# Patient Record
Sex: Female | Born: 1955 | Race: White | Hispanic: No | Marital: Married | Smoking: Never smoker
Health system: Southern US, Community
[De-identification: ages and names within clinical notes are randomized; demographics above are authoritative.]

## PROBLEM LIST (undated history)

## (undated) DIAGNOSIS — C801 Malignant (primary) neoplasm, unspecified: Secondary | ICD-10-CM

## (undated) DIAGNOSIS — Z8042 Family history of malignant neoplasm of prostate: Secondary | ICD-10-CM

## (undated) DIAGNOSIS — T7840XA Allergy, unspecified, initial encounter: Secondary | ICD-10-CM

## (undated) DIAGNOSIS — Z808 Family history of malignant neoplasm of other organs or systems: Secondary | ICD-10-CM

## (undated) DIAGNOSIS — M1990 Unspecified osteoarthritis, unspecified site: Secondary | ICD-10-CM

## (undated) HISTORY — DX: Family history of malignant neoplasm of other organs or systems: Z80.8

## (undated) HISTORY — DX: Family history of malignant neoplasm of prostate: Z80.42

## (undated) HISTORY — DX: Allergy, unspecified, initial encounter: T78.40XA

## (undated) HISTORY — DX: Unspecified osteoarthritis, unspecified site: M19.90

## (undated) HISTORY — DX: Malignant (primary) neoplasm, unspecified: C80.1

## (undated) HISTORY — PX: COLONOSCOPY: SHX174

## (undated) HISTORY — PX: CHOLECYSTECTOMY: SHX55

## (undated) HISTORY — PX: OTHER SURGICAL HISTORY: SHX169

---

## 1999-02-04 ENCOUNTER — Other Ambulatory Visit: Admission: RE | Admit: 1999-02-04 | Discharge: 1999-02-04 | Payer: Self-pay | Admitting: Obstetrics & Gynecology

## 1999-05-20 ENCOUNTER — Encounter: Payer: Self-pay | Admitting: Internal Medicine

## 1999-05-20 ENCOUNTER — Ambulatory Visit (HOSPITAL_COMMUNITY): Admission: RE | Admit: 1999-05-20 | Discharge: 1999-05-20 | Payer: Self-pay | Admitting: Internal Medicine

## 1999-06-14 ENCOUNTER — Ambulatory Visit (HOSPITAL_COMMUNITY): Admission: RE | Admit: 1999-06-14 | Discharge: 1999-06-14 | Payer: Self-pay | Admitting: Internal Medicine

## 2000-03-15 ENCOUNTER — Other Ambulatory Visit: Admission: RE | Admit: 2000-03-15 | Discharge: 2000-03-15 | Payer: Self-pay | Admitting: Obstetrics & Gynecology

## 2001-03-16 ENCOUNTER — Other Ambulatory Visit: Admission: RE | Admit: 2001-03-16 | Discharge: 2001-03-16 | Payer: Self-pay | Admitting: Obstetrics & Gynecology

## 2002-03-19 ENCOUNTER — Other Ambulatory Visit: Admission: RE | Admit: 2002-03-19 | Discharge: 2002-03-19 | Payer: Self-pay | Admitting: Obstetrics & Gynecology

## 2003-03-31 ENCOUNTER — Other Ambulatory Visit: Admission: RE | Admit: 2003-03-31 | Discharge: 2003-03-31 | Payer: Self-pay | Admitting: Obstetrics & Gynecology

## 2004-04-14 ENCOUNTER — Other Ambulatory Visit: Admission: RE | Admit: 2004-04-14 | Discharge: 2004-04-14 | Payer: Self-pay | Admitting: Obstetrics & Gynecology

## 2005-04-22 ENCOUNTER — Other Ambulatory Visit: Admission: RE | Admit: 2005-04-22 | Discharge: 2005-04-22 | Payer: Self-pay | Admitting: Obstetrics & Gynecology

## 2005-05-30 ENCOUNTER — Ambulatory Visit: Payer: Self-pay | Admitting: Internal Medicine

## 2005-06-16 ENCOUNTER — Ambulatory Visit: Payer: Self-pay | Admitting: Internal Medicine

## 2007-05-08 ENCOUNTER — Encounter: Admission: RE | Admit: 2007-05-08 | Discharge: 2007-05-08 | Payer: Self-pay | Admitting: Obstetrics & Gynecology

## 2007-08-23 HISTORY — PX: MELANOMA EXCISION: SHX5266

## 2009-01-16 ENCOUNTER — Encounter (INDEPENDENT_AMBULATORY_CARE_PROVIDER_SITE_OTHER): Payer: Self-pay | Admitting: Surgery

## 2009-01-16 ENCOUNTER — Ambulatory Visit (HOSPITAL_BASED_OUTPATIENT_CLINIC_OR_DEPARTMENT_OTHER): Admission: RE | Admit: 2009-01-16 | Discharge: 2009-01-16 | Payer: Self-pay | Admitting: Surgery

## 2010-04-16 ENCOUNTER — Encounter: Payer: Self-pay | Admitting: Internal Medicine

## 2010-09-11 ENCOUNTER — Encounter: Payer: Self-pay | Admitting: *Deleted

## 2010-09-21 NOTE — Letter (Signed)
Summary: Colonoscopy Letter  [facility]  [address] [place], [place] [address]   Phone: [phone]  Fax: [phone]      April 16, 2010 MRN: [id number]   [facility] [address] [place], [place]  [address]   Dear Ms. MCCAIG,   According to your medical record, it is time for you to schedule a Colonoscopy. The American Cancer Society recommends this procedure as a method to detect early colon cancer. Patients with a family history of colon cancer, or a personal history of colon polyps or inflammatory bowel disease are at increased risk.  This letter has been generated based on the recommendations made at the time of your procedure. If you feel that in your particular situation this may no longer apply, please contact our office.  Please call our office at [phone] to schedule this appointment or to update your records at your earliest convenience.  Thank you for cooperating with [place] to provide you with the very best care possible.   Sincerely,  Wilhemina Bonito. Marina Goodell, M.D.  [facility] [phone]

## 2010-10-13 ENCOUNTER — Other Ambulatory Visit: Payer: Self-pay | Admitting: Dermatology

## 2010-11-30 LAB — BASIC METABOLIC PANEL
BUN: 12 mg/dL (ref 6–23)
CO2: 28 mEq/L (ref 19–32)
Calcium: 9.1 mg/dL (ref 8.4–10.5)
Chloride: 102 mEq/L (ref 96–112)
Creatinine, Ser: 0.77 mg/dL (ref 0.4–1.2)
GFR calc Af Amer: >60 mL/min (ref >60)
GFR calc non Af Amer: >60 mL/min (ref >60)
Glucose, Bld: 153 mg/dL — ABNORMAL HIGH (ref 70–99)
Potassium: 4.1 mEq/L (ref 3.5–5.1)
Sodium: 138 mEq/L (ref 135–145)

## 2010-11-30 LAB — CBC
HCT: 37.4 % (ref 36.0–46.0)
Hemoglobin: 12.9 g/dL (ref 12.0–15.0)
MCHC: 34.6 g/dL (ref 30.0–36.0)
MCV: 89.4 fL (ref 78.0–100.0)
Platelets: 240 10*3/uL (ref 150–400)
RBC: 4.18 MIL/uL (ref 3.87–5.11)
RDW: 13.5 % (ref 11.5–15.5)
WBC: 8.8 10*3/uL (ref 4.0–10.5)

## 2010-11-30 LAB — DIFFERENTIAL
Basophils Relative: 0 % (ref 0–1)
Monocytes Relative: 4 % (ref 3–12)
Neutro Abs: 5 10*3/uL (ref 1.7–7.7)
Neutrophils Relative %: 57 % (ref 43–77)

## 2011-01-04 NOTE — Op Note (Signed)
Jessica Macdonald, Jessica Macdonald               ACCOUNT NO.:  [id number]   MEDICAL RECORD NO.:  [id number]          PATIENT TYPE:  AMB   LOCATION:  DSC                          FACILITY:  [facility]   PHYSICIAN:  Wilmon Arms. Corliss Skains, M.D. DATE OF BIRTH:  11-Mar-1956   DATE OF PROCEDURE:  01/16/2009  DATE OF DISCHARGE:                               OPERATIVE REPORT   PREOPERATIVE DIAGNOSIS:  Melanoma in situ, right arm.   POSTOPERATIVE DIAGNOSIS:  Melanoma in situ, right arm.   PROCEDURE PERFORMED:  Wide excision of melanoma in situ of the right  arm.   SURGEON:  Wilmon Arms. Tsuei, MD   ANESTHESIA:  Local, MAC.   INDICATIONS:  This is a 55 year old female who presents after having a  shave biopsy of abnormal skin lesion on the right upper arm.  The biopsy  showed melanoma in situ with regression with 1 positive margin  laterally.  She presents now for complete wide excision.   DESCRIPTION OF PROCEDURE:  The patient was brought to the operating room  and was placed in the supine position on the operating table with the  right arm extended.  She was given intravenous sedation.  We prepped her  right arm with Betadine and draped in a sterile fashion.  A time-out was  taken to assure the proper patient and proper procedure.  I had outlined  an elliptical incision by taking 1 cm margins around the previous biopsy  site.  We anesthetized this entire area with 0.25% Marcaine with  epinephrine mixed one-to-one with 1% lidocaine.  We made incision with a  15 blade scalpel and took its full-thickness all the way down into the  subcutaneous fat.  Cautery was used to excise the entire specimen.  It  was oriented with a suture distally.  Cautery was then used for  hemostasis.  We closed the subcutaneous tissues with interrupted 3-0  Vicryl.  The 4-0 Monocryl was then used to close the skin in  subcuticular fashion.  Steri-Strips and clean dressings were applied.  The patient was then awakened and brought to the  recovery room in stable  condition.  All sponge, instrument, and needle counts were correct.      Wilmon Arms. Tsuei, M.D.  Electronically Signed     MKT/MEDQ  D:  01/16/2009  T:  01/17/2009  Job:  [id number]

## 2011-10-03 ENCOUNTER — Ambulatory Visit (AMBULATORY_SURGERY_CENTER): Payer: BC Managed Care – PPO | Admitting: *Deleted

## 2011-10-03 VITALS — Ht 63.0 in | Wt 180.0 lb

## 2011-10-03 DIAGNOSIS — Z1211 Encounter for screening for malignant neoplasm of colon: Secondary | ICD-10-CM

## 2011-10-03 MED ORDER — PEG-KCL-NACL-NASULF-NA ASC-C 100 G PO SOLR: ORAL | Status: DC

## 2011-10-04 ENCOUNTER — Encounter: Payer: Self-pay | Admitting: Internal Medicine

## 2011-10-17 ENCOUNTER — Encounter: Payer: Self-pay | Admitting: Internal Medicine

## 2011-10-17 ENCOUNTER — Ambulatory Visit (AMBULATORY_SURGERY_CENTER): Payer: BC Managed Care – PPO | Admitting: Internal Medicine

## 2011-10-17 VITALS — BP 118/76 | HR 76 | Temp 96.0°F | Resp 20 | Ht 63.0 in | Wt 180.0 lb

## 2011-10-17 DIAGNOSIS — Z800 Family history of malignant neoplasm of digestive organs: Secondary | ICD-10-CM

## 2011-10-17 DIAGNOSIS — Z1211 Encounter for screening for malignant neoplasm of colon: Secondary | ICD-10-CM

## 2011-10-17 MED ORDER — SODIUM CHLORIDE 0.9 % IV SOLN: 500.0000 mL | INTRAVENOUS | Status: DC

## 2011-10-17 NOTE — Op Note (Signed)
[facility] [address] [place], [place]  [address]  COLONOSCOPY PROCEDURE REPORT  PATIENT:  Jessica Macdonald, Jessica Macdonald  MR#:  [id number] BIRTHDATE:  [birth year], 55 yrs. old  GENDER:  female ENDOSCOPIST:  Wilhemina Bonito. Eda Keys, MD REF. BY:  Screening / Recall PROCEDURE DATE:  10/17/2011 PROCEDURE:  Higher-risk screening colonoscopy G0105  ASA CLASS:  Class II INDICATIONS:  colorectal cancer screening, high risk, family history of colon cancer ; mother and paternal GP ; last exam 05-2005 normal MEDICATIONS:   MAC sedation, administered by CRNA, propofol (Diprivan) 350 mg IV  DESCRIPTION OF PROCEDURE:   After the risks benefits and alternatives of the procedure were thoroughly explained, informed consent was obtained.  Digital rectal exam was performed and revealed no abnormalities.   The LB CF-H180AL [id number] endoscope was introduced through the anus and advanced to the cecum, which was identified by both the appendix and ileocecal valve, without limitations.  The quality of the prep was excellent, using MoviPrep.  The instrument was then slowly withdrawn as the colon was fully examined. <<PROCEDUREIMAGES>>  FINDINGS:  A normal appearing cecum, ileocecal valve, and appendiceal orifice were identified. The ascending, hepatic flexure, transverse, splenic flexure, descending, sigmoid colon, and rectum appeared unremarkable.  No polyps or cancers were seen. Retroflexed views in the rectum revealed no abnormalities.    The time to cecum =  2:19  minutes. The scope was then withdrawn in 9:49  minutes from the cecum and the procedure completed.  COMPLICATIONS:  None  ENDOSCOPIC IMPRESSION: 1) Normal colonoscopy 2) No polyps or cancers  RECOMMENDATIONS: 1) Follow up colonoscopy in 5 years (family hx)  ______________________________ Wilhemina Bonito. Eda Keys, MD  CC:  Bradd Canary, MD;  The Patient  n. eSIGNED:   Wilhemina Bonito. Eda Keys at 10/17/2011 02:09 PM  Leanor Kail, [phone]

## 2011-10-17 NOTE — Patient Instructions (Signed)

## 2011-10-17 NOTE — Progress Notes (Signed)
Patient did not experience any of the following events: a burn prior to discharge; a fall within the facility; wrong site/side/patient/procedure/implant event; or a hospital transfer or hospital admission upon discharge from the facility. (G8907) Patient did not have preoperative order for IV antibiotic SSI prophylaxis. (G8918)  

## 2011-10-18 ENCOUNTER — Telehealth: Payer: Self-pay | Admitting: *Deleted

## 2011-10-18 NOTE — Telephone Encounter (Signed)
  Follow up Call-  Call back number 10/17/2011  Post procedure Call Back phone  # [phone]  Permission to leave phone message Yes     Patient questions:  Do you have a fever, pain , or abdominal swelling? no   Have you tolerated food without any problems? yes  Have you been able to return to your normal activities? yes  Do you have any questions about your discharge instructions: Diet   no Medications  no Follow up visit  no  Do you have questions or concerns about your Care? no  Actions:

## 2012-09-05 ENCOUNTER — Other Ambulatory Visit: Payer: Self-pay | Admitting: Dermatology

## 2013-05-14 ENCOUNTER — Other Ambulatory Visit: Payer: Self-pay | Admitting: Dermatology

## 2013-10-30 ENCOUNTER — Other Ambulatory Visit: Payer: Self-pay | Admitting: Dermatology

## 2014-09-23 ENCOUNTER — Other Ambulatory Visit: Payer: Self-pay | Admitting: Obstetrics & Gynecology

## 2014-09-24 LAB — CYTOLOGY - PAP

## 2015-08-31 ENCOUNTER — Encounter (HOSPITAL_COMMUNITY): Payer: Self-pay

## 2015-08-31 ENCOUNTER — Emergency Department (HOSPITAL_COMMUNITY)
Admission: EM | Admit: 2015-08-31 | Discharge: 2015-09-01 | Disposition: A | Discharge status: Home / Self Care | Payer: BLUE CROSS/BLUE SHIELD | Attending: Emergency Medicine | Admitting: Emergency Medicine

## 2015-08-31 ENCOUNTER — Emergency Department (HOSPITAL_COMMUNITY): Payer: BLUE CROSS/BLUE SHIELD

## 2015-08-31 DIAGNOSIS — K8000 Calculus of gallbladder with acute cholecystitis without obstruction: Secondary | ICD-10-CM | POA: Diagnosis not present

## 2015-08-31 DIAGNOSIS — R1011 Right upper quadrant pain: Secondary | ICD-10-CM | POA: Diagnosis present

## 2015-08-31 DIAGNOSIS — Z79899 Other long term (current) drug therapy: Secondary | ICD-10-CM | POA: Diagnosis not present

## 2015-08-31 LAB — CBC
HEMATOCRIT: 40 % (ref 36.0–46.0)
HEMOGLOBIN: 12.7 g/dL (ref 12.0–15.0)
MCH: 29.5 pg (ref 26.0–34.0)
MCHC: 31.8 g/dL (ref 30.0–36.0)
MCV: 93 fL (ref 78.0–100.0)
Platelets: 296 10*3/uL (ref 150–400)
RBC: 4.3 MIL/uL (ref 3.87–5.11)
RDW: 13.6 % (ref 11.5–15.5)
WBC: 10.3 10*3/uL (ref 4.0–10.5)

## 2015-08-31 LAB — COMPREHENSIVE METABOLIC PANEL
ALBUMIN: 4.3 g/dL (ref 3.5–5.0)
ALT: 37 U/L (ref 14–54)
ANION GAP: 9 (ref 5–15)
AST: 50 U/L — AB (ref 15–41)
Alkaline Phosphatase: 131 U/L — ABNORMAL HIGH (ref 38–126)
BUN: 18 mg/dL (ref 6–20)
CO2: 28 mmol/L (ref 22–32)
Calcium: 9.6 mg/dL (ref 8.9–10.3)
Chloride: 102 mmol/L (ref 101–111)
Creatinine, Ser: 0.77 mg/dL (ref 0.44–1.00)
GFR calc Af Amer: >60 mL/min (ref >60)
GFR calc non Af Amer: >60 mL/min (ref >60)
GLUCOSE: 103 mg/dL — AB (ref 65–99)
POTASSIUM: 4.6 mmol/L (ref 3.5–5.1)
SODIUM: 139 mmol/L (ref 135–145)
Total Bilirubin: 0.6 mg/dL (ref 0.3–1.2)
Total Protein: 7.7 g/dL (ref 6.5–8.1)

## 2015-08-31 LAB — LIPASE, BLOOD: Lipase: 32 U/L (ref 11–51)

## 2015-08-31 NOTE — ED Notes (Signed)
Pt c/o intermittent R side, sharp, rib cage pain, abdominal pain/burning, and intermittent n/v/d x 3 days.  Pain score 5/10.  Pt reports taking Tums w/ some relief.  Pt reports "I've been battling this for a year."  Pt has not mentioned it to her PCP.

## 2015-08-31 NOTE — ED Notes (Signed)
Patient transported to Ultrasound 

## 2015-08-31 NOTE — ED Provider Notes (Signed)
[name]: [id number]     Arrival date & time 08/31/15  1814 History   First MD Initiated Contact with Patient 08/31/15 2206     Chief Complaint  Patient presents with  . Ribcage pain   . Abdominal Pain  . Emesis  . Diarrhea     (Consider location/radiation/quality/duration/timing/severity/associated sxs/prior Treatment) HPI Comments: Patient reports intermittent RUQ pain for several months, occasionally associated with nausea, vomiting, and diarrhea.  Current episode started on Friday, with pain more persistent than prior episodes.  Some relief of pain with antacids today. Pain is worse after eating.  Patient is a 60 y.o. female presenting with abdominal pain, vomiting, and diarrhea. The history is provided by the patient. No language interpreter was used.  Abdominal Pain Pain location:  RUQ Pain quality: burning   Pain radiates to:  R flank Pain severity:  Moderate Onset quality:  Gradual Duration:  3 days Timing:  Intermittent Progression:  Waxing and waning Chronicity:  Recurrent Associated symptoms: diarrhea, nausea and vomiting   Associated symptoms: no chills and no fever   Emesis Associated symptoms: abdominal pain and diarrhea   Associated symptoms: no chills   Diarrhea Associated symptoms: abdominal pain and vomiting   Associated symptoms: no chills and no fever     Past Medical History  Diagnosis Date  . Allergy     unknown   Past Surgical History  Procedure Laterality Date  . Melanoma excision  2009    right upper arm   Family History  Problem Relation Age of Onset  . Colon cancer Mother   . Colon cancer Paternal Grandmother    Social History  Substance Use Topics  . Smoking status: Never Smoker   . Smokeless tobacco: Never Used  . Alcohol Use: 0.6 oz/week    1 Glasses of wine per week   OB History    No data available     Review of Systems  Constitutional: Negative for fever and chills.  Gastrointestinal: Positive for nausea, vomiting, abdominal  pain and diarrhea.  Musculoskeletal: Positive for back pain.  All other systems reviewed and are negative.     Allergies  Review of patient's allergies indicates no known allergies.  Home Medications   Prior to Admission medications   Medication Sig Start Date End Date Taking? Authorizing Provider  b complex vitamins tablet Take 1 tablet by mouth daily.    Historical Provider, MD  Calcium Carbonate-Vitamin D (CALTRATE 600+D PO) Take 2 tablets by mouth 2 (two) times daily. Takes chewy ones    Historical Provider, MD  fish oil-omega-3 fatty acids 1000 MG capsule Take 1 g by mouth 2 (two) times a week.    Historical Provider, MD  Probiotic Product (PROBIOTIC & ACIDOPHILUS EX ST PO) Take 1 tablet by mouth daily.    Historical Provider, MD  Pseudoephedrine-Ibuprofen (IBUPROFEN AND PSE COLD & SINUS PO) Take 2 tablets by mouth as needed. allergies    Historical Provider, MD   BP 146/82 mmHg  Pulse 91  Temp(Src) 98.2 F (36.8 C) (Oral)  Resp 16  SpO2 100% Physical Exam  Constitutional: She is oriented to person, place, and time. She appears well-developed and well-nourished.  HENT:  Head: Normocephalic.  Eyes: Pupils are equal, round, and reactive to light.  Neck: Neck supple.  Cardiovascular: Normal rate and regular rhythm.   Pulmonary/Chest: Effort normal and breath sounds normal.  Abdominal: Soft. She exhibits no distension. There is tenderness. There is CVA tenderness.    Musculoskeletal: She exhibits no  edema or tenderness.  Neurological: She is alert and oriented to person, place, and time.  Skin: Skin is warm and dry.  Psychiatric: She has a normal mood and affect.  Nursing note and vitals reviewed.   ED Course  Procedures (including critical care time) Labs Review Labs Reviewed  COMPREHENSIVE METABOLIC PANEL - Abnormal; Notable for the following:    Glucose, Bld 103 (*)    AST 50 (*)    Alkaline Phosphatase 131 (*)    All other components within normal limits    LIPASE, BLOOD  CBC  URINALYSIS, ROUTINE W REFLEX MICROSCOPIC (NOT AT [facility])    Imaging Review US Abdomen Complete  08/31/2015  CLINICAL DATA:  Right upper quadrant and right flank pain. Symptoms for 3 days. EXAM: ABDOMEN ULTRASOUND COMPLETE COMPARISON:  None. FINDINGS: Gallbladder: Gallbladder filled with shadowing stones with a wall echo shadow complex. Gallbladder wall where visualized is normal. Sonographic Murphy sign is negative. Common bile duct: Diameter: 4.8 mm, normal. Liver: No focal lesion identified. Within normal limits in parenchymal echogenicity. Normal directional flow in the main portal vein. IVC: No abnormality visualized. Pancreas: Visualized portion unremarkable. Spleen: Size and appearance within normal limits. Right Kidney: Length: 10.7 cm. Echogenicity within normal limits. No mass or hydronephrosis visualized. Left Kidney: Length: 10.0 cm. Echogenicity within normal limits. No mass or hydronephrosis visualized. Abdominal aorta: No aneurysm visualized. Other findings: None.  No ascites. IMPRESSION: Cholelithiasis with gallbladder filled with stones creating a wall echo shadow complex. Gallbladder wall thickness normal were visualized. Negative sonographic Murphy sign. No biliary dilatation. Otherwise unremarkable abdominal ultrasound. Particularly, right kidney appears normal without hydronephrosis. Electronically Signed   By: Jeb Levering M.D.   On: 08/31/2015 23:30   I have personally reviewed and evaluated these images and lab results as part of my medical decision-making.   EKG Interpretation None     Lab and radiology results reviewed and shared with patient. Cholelithiasis. No obstruction or wall thickening. No indication of infection. Will discharge home with pain and nausea Rx. Follow-up with general surgery.  MDM   Final diagnoses:  None    Cholelithiasis. Care instructions provided. Return precautions discussed.    Etta Quill, NP 09/01/15  0023  Julianne Rice, MD 09/01/15 [phone]

## 2015-09-01 LAB — URINALYSIS, ROUTINE W REFLEX MICROSCOPIC
BILIRUBIN URINE: NEGATIVE
GLUCOSE, UA: NEGATIVE mg/dL
HGB URINE DIPSTICK: NEGATIVE
Ketones, ur: NEGATIVE mg/dL
Nitrite: NEGATIVE
PH: 6 (ref 5.0–8.0)
Protein, ur: NEGATIVE mg/dL
SPECIFIC GRAVITY, URINE: 1.015 (ref 1.005–1.030)

## 2015-09-01 LAB — URINE MICROSCOPIC-ADD ON
Bacteria, UA: NONE SEEN
RBC / HPF: NONE SEEN RBC/hpf (ref 0–5)

## 2015-09-01 MED ORDER — HYDROCODONE-ACETAMINOPHEN 5-325 MG PO TABS: 1.0000 | ORAL_TABLET | Freq: Four times a day (QID) | ORAL | Status: DC | PRN

## 2015-09-01 MED ORDER — ONDANSETRON 4 MG PO TBDP: ORAL_TABLET | ORAL | Status: DC

## 2015-09-01 NOTE — ED Notes (Signed)
Patient was alert, oriented and stable upon discharge. RN went over AVS and patient had no further questions.  

## 2015-09-01 NOTE — Discharge Instructions (Signed)

## 2015-10-13 ENCOUNTER — Other Ambulatory Visit: Payer: Self-pay | Admitting: Surgery

## 2016-08-24 IMAGING — US US ABDOMEN COMPLETE
1 series · 13 of 25 positions shown · non-contrast
Comparison: None.

CLINICAL DATA: Right upper quadrant and right flank pain. Symptoms
for 3 days.

EXAM:
ABDOMEN ULTRASOUND COMPLETE

[Series 1: us abdomen complete · 0.16mm/px · 13 of 66 slices shown]
[im 1/66]
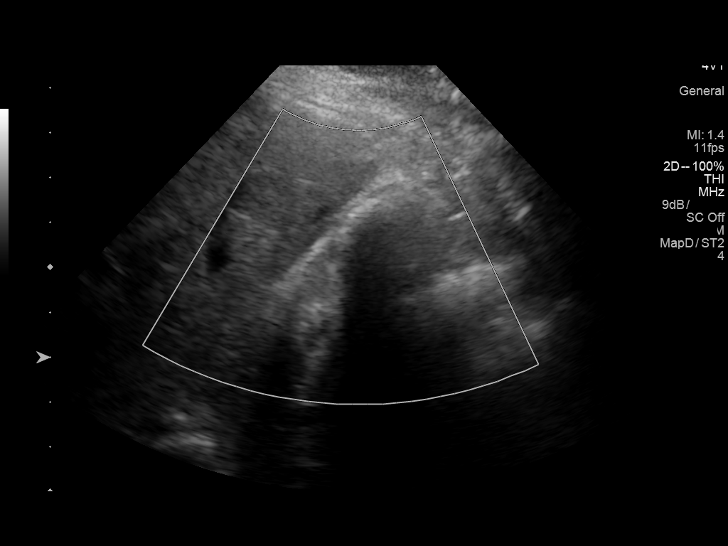
[im 6/66]
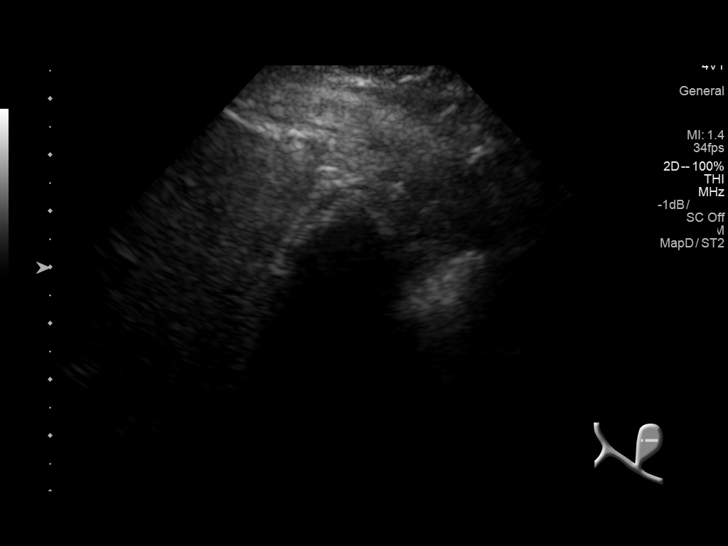
[im 11/66]
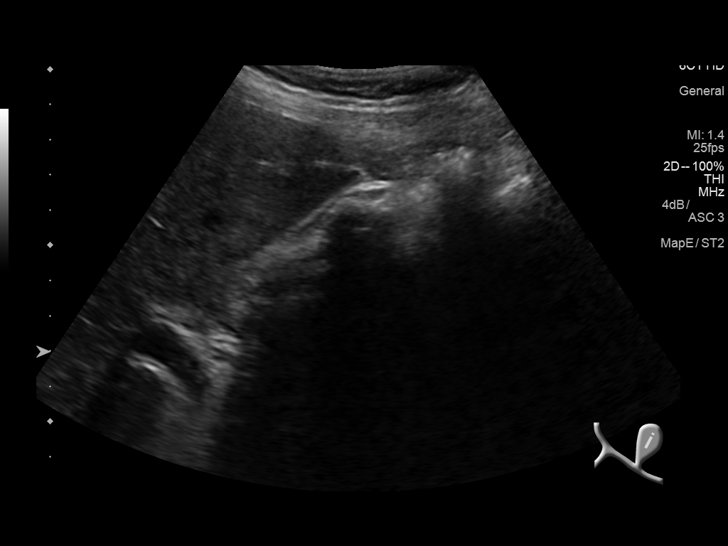
[im 17/66]
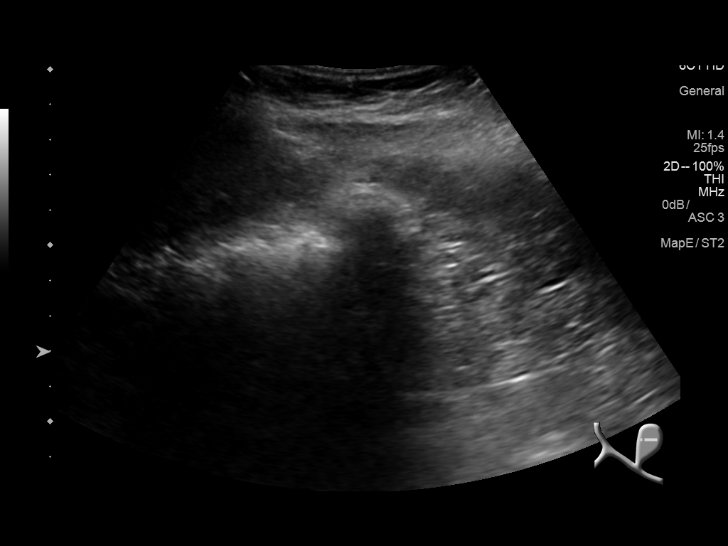
[im 22/66]
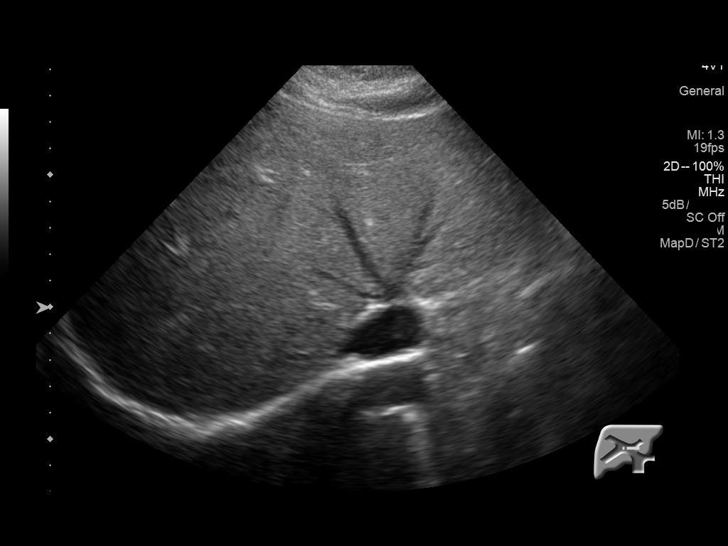
[im 28/66]
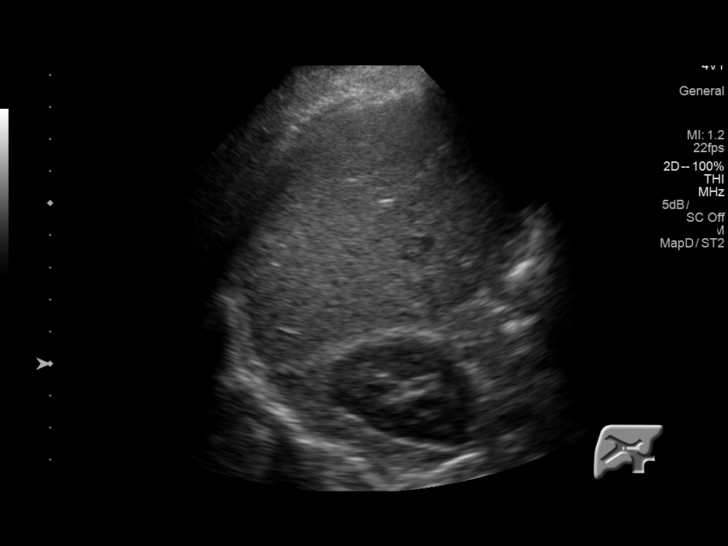
[im 33/66]
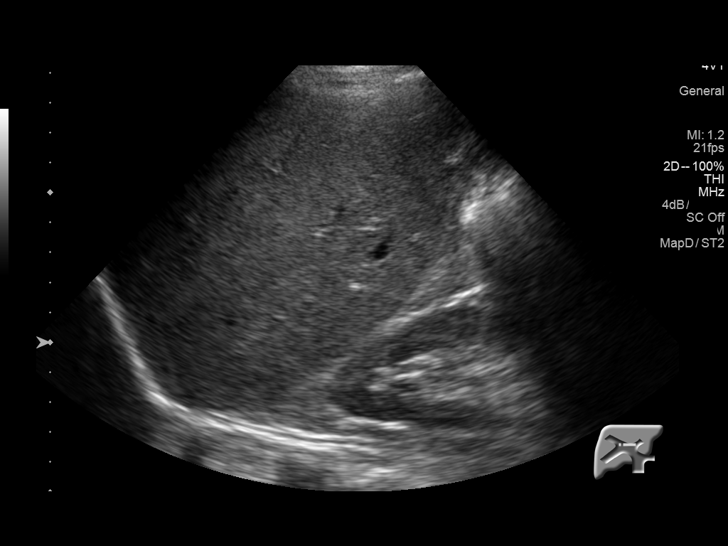
[im 38/66]
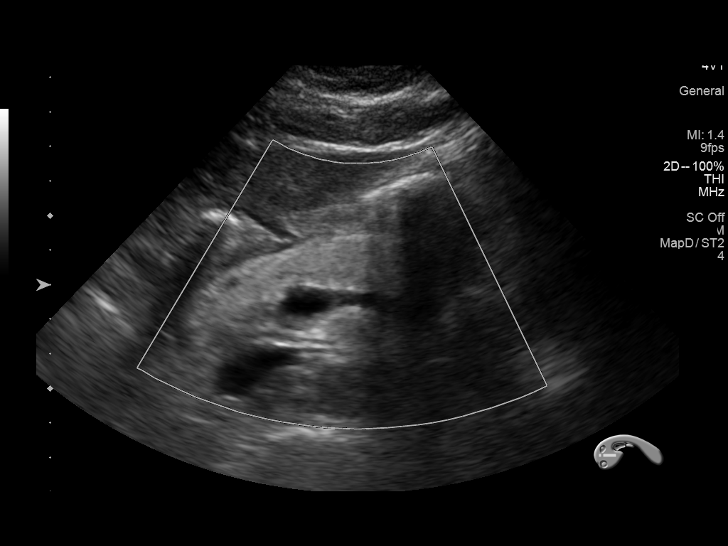
[im 44/66]
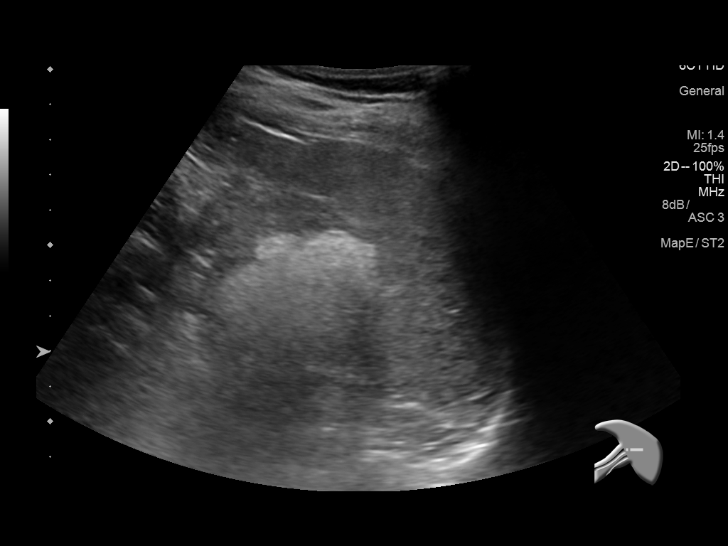
[im 49/66]
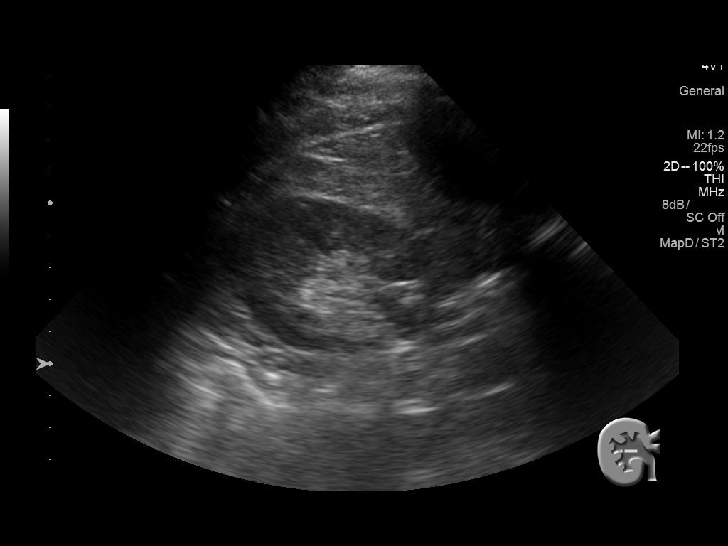
[im 55/66]
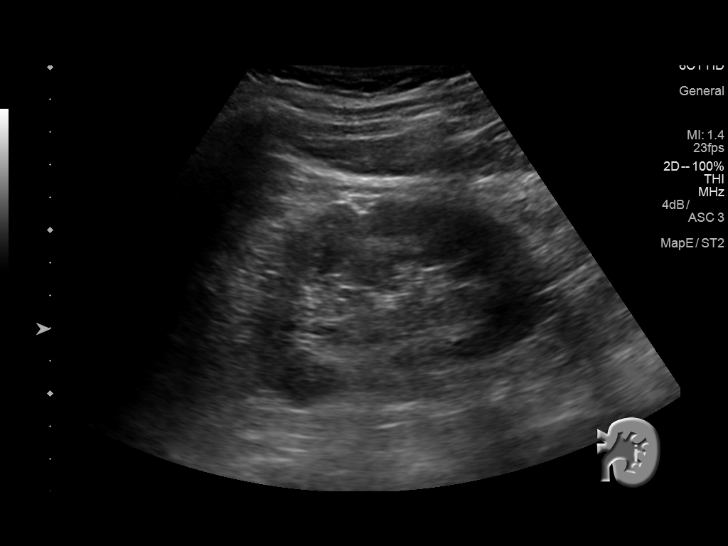
[im 60/66]
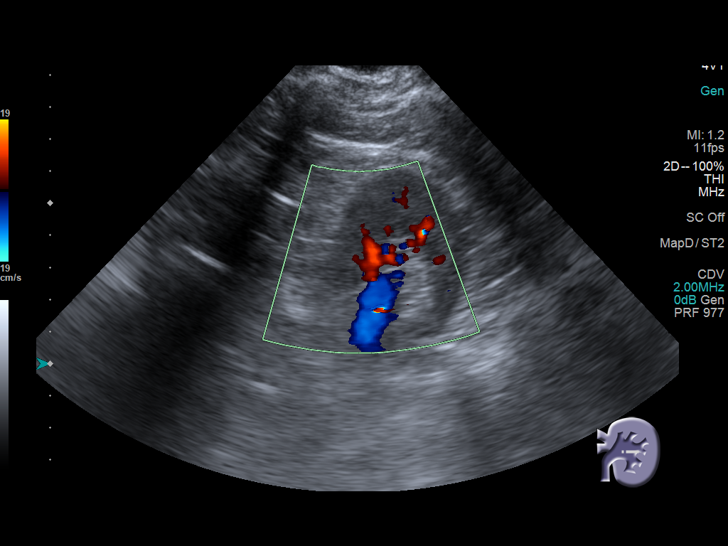
[im 66/66]
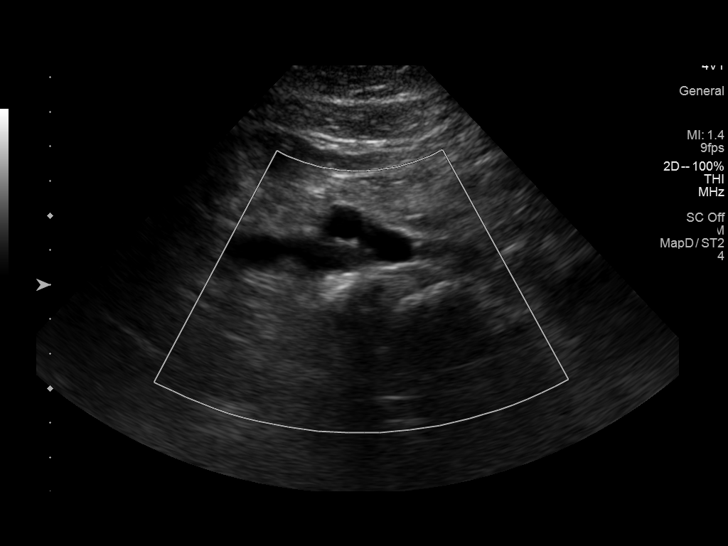

[13 of 25 positions shown; findings below may reference images not displayed]

FINDINGS: Gallbladder: Gallbladder filled with shadowing stones with a wall
echo shadow complex. Gallbladder wall where visualized is normal.
Sonographic Murphy sign is negative.

Common bile duct: Diameter: 4.8 mm, normal.

Liver: No focal lesion identified. Within normal limits in
parenchymal echogenicity. Normal directional flow in the main portal
vein.

IVC: No abnormality visualized.

Pancreas: Visualized portion unremarkable.

Spleen: Size and appearance within normal limits.

Right Kidney: Length: 10.7 cm. Echogenicity within normal limits. No
mass or hydronephrosis visualized.

Left Kidney: Length: 10.0 cm. Echogenicity within normal limits. No
mass or hydronephrosis visualized.

Abdominal aorta: No aneurysm visualized.

Other findings: None.  No ascites.
IMPRESSION: Cholelithiasis with gallbladder filled with stones creating a wall
echo shadow complex. Gallbladder wall thickness normal were
visualized. Negative sonographic Murphy sign. No biliary dilatation.

Otherwise unremarkable abdominal ultrasound. Particularly, right
kidney appears normal without hydronephrosis.

## 2016-08-31 ENCOUNTER — Encounter: Payer: Self-pay | Admitting: Internal Medicine

## 2016-09-27 ENCOUNTER — Encounter: Payer: Self-pay | Admitting: Internal Medicine

## 2016-11-22 ENCOUNTER — Ambulatory Visit (AMBULATORY_SURGERY_CENTER): Payer: Self-pay | Admitting: *Deleted

## 2016-11-22 VITALS — Ht 63.0 in | Wt 181.2 lb

## 2016-11-22 DIAGNOSIS — Z800 Family history of malignant neoplasm of digestive organs: Secondary | ICD-10-CM

## 2016-11-22 MED ORDER — NA SULFATE-K SULFATE-MG SULF 17.5-3.13-1.6 GM/177ML PO SOLN: ORAL | 0 refills | Status: DC

## 2016-11-22 NOTE — Progress Notes (Signed)
Hold phentermine for 10 days prior to procedure.  Patient aware.

## 2016-11-22 NOTE — Progress Notes (Signed)
Pt denies allergies to eggs or soy products. Denies difficulty with sedation or anesthesia. Denies any diet or weight loss medications. Denies use of supplemental oxygen.  Emmi instructions given for procedure.  

## 2016-11-23 ENCOUNTER — Encounter: Payer: Self-pay | Admitting: Internal Medicine

## 2016-12-06 ENCOUNTER — Ambulatory Visit (AMBULATORY_SURGERY_CENTER): Payer: BLUE CROSS/BLUE SHIELD | Admitting: Internal Medicine

## 2016-12-06 ENCOUNTER — Encounter: Payer: Self-pay | Admitting: Internal Medicine

## 2016-12-06 VITALS — BP 111/73 | HR 70 | Temp 97.3°F | Resp 13 | Ht 63.0 in | Wt 181.0 lb

## 2016-12-06 DIAGNOSIS — D123 Benign neoplasm of transverse colon: Secondary | ICD-10-CM

## 2016-12-06 DIAGNOSIS — D122 Benign neoplasm of ascending colon: Secondary | ICD-10-CM

## 2016-12-06 DIAGNOSIS — Z1212 Encounter for screening for malignant neoplasm of rectum: Secondary | ICD-10-CM

## 2016-12-06 DIAGNOSIS — D124 Benign neoplasm of descending colon: Secondary | ICD-10-CM | POA: Diagnosis not present

## 2016-12-06 DIAGNOSIS — Z1211 Encounter for screening for malignant neoplasm of colon: Secondary | ICD-10-CM

## 2016-12-06 DIAGNOSIS — Z800 Family history of malignant neoplasm of digestive organs: Secondary | ICD-10-CM | POA: Diagnosis not present

## 2016-12-06 MED ORDER — SODIUM CHLORIDE 0.9 % IV SOLN: 500.0000 mL | INTRAVENOUS | Status: DC

## 2016-12-06 NOTE — Progress Notes (Signed)
Spontaneous respirations throughout. VSS. Resting comfortably. To PACU on room air. Report to  Michelle RN. 

## 2016-12-06 NOTE — Progress Notes (Signed)
Called to room to assist during endoscopic procedure.  Patient ID and intended procedure confirmed with present staff. Received instructions for my participation in the procedure from the performing physician.  

## 2016-12-06 NOTE — Progress Notes (Signed)
Pt's states no medical or surgical changes since previsit or office visit. 

## 2016-12-06 NOTE — Patient Instructions (Signed)
YOU HAD AN ENDOSCOPIC PROCEDURE TODAY AT [facility]:   Refer to the procedure report that was given to you for any specific questions about what was found during the examination.  If the procedure report does not answer your questions, please call your gastroenterologist to clarify.  If you requested that your care partner not be given the details of your procedure findings, then the procedure report has been included in a sealed envelope for you to review at your convenience later.  YOU SHOULD EXPECT: Some feelings of bloating in the abdomen. Passage of more gas than usual.  Walking can help get rid of the air that was put into your GI tract during the procedure and reduce the bloating. If you had a lower endoscopy (such as a colonoscopy or flexible sigmoidoscopy) you may notice spotting of blood in your stool or on the toilet paper. If you underwent a bowel prep for your procedure, you may not have a normal bowel movement for a few days.  Please Note:  You might notice some irritation and congestion in your nose or some drainage.  This is from the oxygen used during your procedure.  There is no need for concern and it should clear up in a day or so.  SYMPTOMS TO REPORT IMMEDIATELY:   Following lower endoscopy (colonoscopy or flexible sigmoidoscopy):  Excessive amounts of blood in the stool  Significant tenderness or worsening of abdominal pains  Swelling of the abdomen that is new, acute  Fever of 100F or higher   For urgent or emergent issues, a gastroenterologist can be reached at any hour by calling [phone].   DIET:  We do recommend a small meal at first, but then you may proceed to your regular diet.  Drink plenty of fluids but you should avoid alcoholic beverages for 24 hours.  ACTIVITY:  You should plan to take it easy for the rest of today and you should NOT DRIVE or use heavy machinery until tomorrow (because of the sedation medicines used during the test).     FOLLOW UP: Our staff will call the number listed on your records the next business day following your procedure to check on you and address any questions or concerns that you may have regarding the information given to you following your procedure. If we do not reach you, we will leave a message.  However, if you are feeling well and you are not experiencing any problems, there is no need to return our call.  We will assume that you have returned to your regular daily activities without incident.  If any biopsies were taken you will be contacted by phone or by letter within the next 1-3 weeks.  Please call us at [phone] if you have not heard about the biopsies in 3 weeks.   Await biopsy results Repeat Colonoscopy screening in 3 years Polyps (handout given)   SIGNATURES/CONFIDENTIALITY: You and/or your care partner have signed paperwork which will be entered into your electronic medical record.  These signatures attest to the fact that that the information above on your After Visit Summary has been reviewed and is understood.  Full responsibility of the confidentiality of this discharge information lies with you and/or your care-partner.

## 2016-12-06 NOTE — Op Note (Signed)
[place] Patient Name: Jessica Macdonald Procedure Date: 12/06/2016 10:17 AM MRN: [id number] Endoscopist: Docia Chuck. Henrene Pastor , MD Age: 61 Referring MD:  Date of Birth: 11-12-1955 Gender: Female Account #: [id number] Procedure:                Colonoscopy, with cold snare polypectomy x 3 Indications:              Screening in patient at increased risk: Colorectal                            cancer in mother about age 46. Also paternal                            grandfather. Prior examinations 2001, 2006, and                            2013 negative for neoplasia Medicines:                Monitored Anesthesia Care Procedure:                Pre-Anesthesia Assessment:                           - Prior to the procedure, a History and Physical                            was performed, and patient medications and                            allergies were reviewed. The patient's tolerance of                            previous anesthesia was also reviewed. The risks                            and benefits of the procedure and the sedation                            options and risks were discussed with the patient.                            All questions were answered, and informed consent                            was obtained. Prior Anticoagulants: The patient has                            taken no previous anticoagulant or antiplatelet                            agents. ASA Grade Assessment: II - A patient with                            mild systemic disease. After reviewing the risks  and benefits, the patient was deemed in                            satisfactory condition to undergo the procedure.                           After obtaining informed consent, the colonoscope                            was passed under direct vision. Throughout the                            procedure, the patient's blood pressure, pulse, and                            oxygen  saturations were monitored continuously. The                            Model CF-[id number]) scope was introduced                            through the anus and advanced to the the cecum,                            identified by appendiceal orifice and ileocecal                            valve. The ileocecal valve, appendiceal orifice,                            and rectum were photographed. The quality of the                            bowel preparation was excellent. The colonoscopy                            was performed without difficulty. The patient                            tolerated the procedure well. The bowel preparation                            used was SUPREP. Scope In: 10:29:47 AM Scope Out: 10:42:34 AM Scope Withdrawal Time: 0 hours 10 minutes 49 seconds  Total Procedure Duration: 0 hours 12 minutes 47 seconds  Findings:                 Three polyps were found in the descending colon,                            transverse colon and ascending colon. The polyps                            were 3 to 6 mm in size. These polyps were removed  with a cold snare. Resection and retrieval were                            complete.                           The exam was otherwise without abnormality on                            direct and retroflexion views. Complications:            No immediate complications. Estimated blood loss:                            None. Estimated Blood Loss:     Estimated blood loss: none. Impression:               - Three 3 to 6 mm polyps in the descending colon,                            in the transverse colon and in the ascending colon,                            removed with a cold snare. Resected and retrieved.                           - The examination was otherwise normal on direct                            and retroflexion views. Recommendation:           - Repeat colonoscopy in 3 years if all polyps                             adenomatous, otherwise 5 years for surveillance.                           - Patient has a contact number available for                            emergencies. The signs and symptoms of potential                            delayed complications were discussed with the                            patient. Return to normal activities tomorrow.                            Written discharge instructions were provided to the                            patient.                           - Resume previous diet.                           -  Continue present medications.                           - Await pathology results. Docia Chuck. Henrene Pastor, MD 12/06/2016 10:48:21 AM This report has been signed electronically.

## 2016-12-07 ENCOUNTER — Telehealth: Payer: Self-pay

## 2016-12-07 NOTE — Telephone Encounter (Signed)
  Follow up Call-  Call back number 12/06/2016  Post procedure Call Back phone  # [phone]  Phone comments CALL THE [phone] HAD GIVEN DAUGHTER NUMBER .  Permission to leave phone message Yes  Some recent data might be hidden    Patient was called for follow up after her procedure on 12/06/16. No answer at the number given for follow up phone call. A message was left on voice mail.

## 2016-12-07 NOTE — Telephone Encounter (Signed)
  Follow up Call-  Call back number 12/06/2016  Post procedure Call Back phone  # [phone]  Phone comments CALL THE [phone] HAD GIVEN DAUGHTER NUMBER .  Permission to leave phone message Yes  Some recent data might be hidden     Patient questions:  Do you have a fever, pain , or abdominal swelling? No. Pain Score  0 *  Have you tolerated food without any problems? Yes.    Have you been able to return to your normal activities? Yes.    Do you have any questions about your discharge instructions: Diet   No. Medications  No. Follow up visit  Yes.    Do you have questions or concerns about your Care? No.  Actions: * If pain score is 4 or above: No action needed, pain <4.

## 2016-12-09 ENCOUNTER — Encounter: Payer: Self-pay | Admitting: Internal Medicine

## 2019-05-24 ENCOUNTER — Other Ambulatory Visit: Payer: Self-pay

## 2019-05-24 ENCOUNTER — Encounter: Payer: Self-pay | Admitting: Internal Medicine

## 2019-05-24 ENCOUNTER — Ambulatory Visit: Payer: BC Managed Care – PPO | Admitting: Internal Medicine

## 2019-05-24 VITALS — BP 129/77 | HR 74 | Temp 97.8°F | Ht 63.0 in | Wt 200.4 lb

## 2019-05-24 DIAGNOSIS — Z8371 Family history of colonic polyps: Secondary | ICD-10-CM

## 2019-05-24 DIAGNOSIS — K625 Hemorrhage of anus and rectum: Secondary | ICD-10-CM

## 2019-05-24 DIAGNOSIS — R194 Change in bowel habit: Secondary | ICD-10-CM | POA: Diagnosis not present

## 2019-05-24 MED ORDER — NA SULFATE-K SULFATE-MG SULF 17.5-3.13-1.6 GM/177ML PO SOLN: 1.0000 | Freq: Once | ORAL | 0 refills | Status: AC

## 2019-05-24 NOTE — Patient Instructions (Signed)
You have been scheduled for a colonoscopy. Please follow written instructions given to you at your visit today.  Please pick up your prep supplies at the pharmacy within the next 1-3 days. If you use inhalers (even only as needed), please bring them with you on the day of your procedure.   

## 2019-05-24 NOTE — Progress Notes (Signed)
HISTORY OF PRESENT ILLNESS:  Jessica Macdonald is a 63 y.o. female with a history of melanoma and prior cholecystectomy (2017) who presents today regarding change in bowel habits, rectal bleeding.  Patient reports a one-year history of intermittent rectal bleeding.  Mostly blood on the tissue.  Some associated rectal discomfort.  She wonders about hemorrhoids.  She is concerned about colon cancer.  She has had some weight gain.  Neck she reports change in bowel habits since her cholecystectomy.  In particular, her stools are softer or looser.  She has more postprandial urgency.  Patient does have a family history of colon cancer in her mother and grandfather.  Prior colonoscopic examinations 2001, 2006, 2013, and 2018.  Her most recent examination revealed multiple (3) adenomas.  Follow-up in 3 years recommended.  REVIEW OF SYSTEMS:  All non-GI ROS negative unless otherwise stated in the HPI except for itching  Past Medical History:  Diagnosis Date  . Allergy    seasonal  . Cancer ([place])    melanoma    Past Surgical History:  Procedure Laterality Date  . carpel tunnel    . CHOLECYSTECTOMY     2017 February  . MELANOMA EXCISION  2009   right upper arm    Social History Jessica Macdonald  reports that she has never smoked. She has never used smokeless tobacco. She reports current alcohol use of about 1.0 standard drinks of alcohol per week. She reports that she does not use drugs.  family history includes Colon cancer in her mother and paternal grandmother; Heart disease in her father.  No Known Allergies     PHYSICAL EXAMINATION: Vital signs: BP 129/77   Pulse 74   Temp 97.8 F (36.6 C)   Ht 5\' 3"  (1.6 m)   Wt 200 lb 6.4 oz (90.9 kg)   SpO2 98%   BMI 35.50 kg/m   Constitutional: generally well-appearing, no acute distress Psychiatric: alert and oriented x3, cooperative Eyes: extraocular movements intact, anicteric, conjunctiva pink Mouth: oral pharynx moist, no lesions Neck:  supple no lymphadenopathy Cardiovascular: heart regular rate and rhythm, no murmur Lungs: clear to auscultation bilaterally Abdomen: soft, nontender, nondistended, no obvious ascites, no peritoneal signs, normal bowel sounds, no organomegaly Rectal: Deferred until colonoscopy Extremities: no clubbing, cyanosis, or lower extremity edema bilaterally Skin: no lesions on visible extremities Neuro: No focal deficits.  Cranial nerves intact  ASSESSMENT:  1.  History of multiple adenomatous colon polyps 2.  Family history of colon cancer 3.  Minor rectal bleeding with rectal discomfort.  Rule out hemorrhoids.  Rule out fissure 4.  Change in bowel habits postcholecystectomy.  Suspect bile salt related loose stools   PLAN:  1.  Schedule colonoscopy to evaluate bleeding, change in bowel habits, and provide neoplasia surveillance.The nature of the procedure, as well as the risks, benefits, and alternatives were carefully and thoroughly reviewed with the patient. Ample time for discussion and questions allowed. The patient understood, was satisfied, and agreed to proceed. 2.  Would recommend fiber supplementation with Metamucil postcholecystectomy to improve bowel habit consistency 3.  Address etiology of rectal bleeding at the time of her colonoscopy

## 2019-05-27 ENCOUNTER — Encounter: Payer: Self-pay | Admitting: Internal Medicine

## 2019-05-27 ENCOUNTER — Other Ambulatory Visit: Payer: Self-pay

## 2019-05-27 ENCOUNTER — Ambulatory Visit (AMBULATORY_SURGERY_CENTER): Payer: BC Managed Care – PPO | Admitting: Internal Medicine

## 2019-05-27 VITALS — BP 112/75 | HR 75 | Temp 98.6°F | Resp 15 | Wt 200.0 lb

## 2019-05-27 DIAGNOSIS — K625 Hemorrhage of anus and rectum: Secondary | ICD-10-CM | POA: Diagnosis not present

## 2019-05-27 DIAGNOSIS — Z86010 Personal history of colonic polyps: Secondary | ICD-10-CM | POA: Diagnosis not present

## 2019-05-27 DIAGNOSIS — Z800 Family history of malignant neoplasm of digestive organs: Secondary | ICD-10-CM

## 2019-05-27 DIAGNOSIS — R194 Change in bowel habit: Secondary | ICD-10-CM

## 2019-05-27 DIAGNOSIS — Z8371 Family history of colonic polyps: Secondary | ICD-10-CM

## 2019-05-27 MED ORDER — SODIUM CHLORIDE 0.9 % IV SOLN: 500.0000 mL | Freq: Once | INTRAVENOUS | Status: DC

## 2019-05-27 NOTE — Op Note (Signed)
[facility] Patient Name: Jessica Macdonald Procedure Date: 05/27/2019 8:34 AM MRN: [id number] Endoscopist: Docia Chuck. Henrene Pastor , MD Age: 63 Referring MD:  Date of Birth: [birth year] Gender: Female Account #: [id number] Procedure:                Colonoscopy Indications:              High risk colon cancer surveillance: Personal                            history of multiple (3 or more) adenomas. Previous                            examinations 2001, 2006, 2013, 2018. Parent and                            grandparent with a history of colon cancer. Patient                            also complained of intermittent minor rectal                            bleeding and change in bowel habits Medicines:                Monitored Anesthesia Care Procedure:                Pre-Anesthesia Assessment:                           - Prior to the procedure, a History and Physical                            was performed, and patient medications and                            allergies were reviewed. The patient's tolerance of                            previous anesthesia was also reviewed. The risks                            and benefits of the procedure and the sedation                            options and risks were discussed with the patient.                            All questions were answered, and informed consent                            was obtained. Prior Anticoagulants: The patient has                            taken no previous anticoagulant or antiplatelet  agents. ASA Grade Assessment: II - A patient with                            mild systemic disease. After reviewing the risks                            and benefits, the patient was deemed in                            satisfactory condition to undergo the procedure.                           After obtaining informed consent, the colonoscope                            was passed under direct vision.  Throughout the                            procedure, the patient's blood pressure, pulse, and                            oxygen saturations were monitored continuously. The                            Colonoscope was introduced through the anus and                            advanced to the the cecum, identified by                            appendiceal orifice and ileocecal valve. The                            ileocecal valve, appendiceal orifice, and rectum                            were photographed. The quality of the bowel                            preparation was excellent. The colonoscopy was                            performed without difficulty. The patient tolerated                            the procedure well. The bowel preparation used was                            SUPREP via split dose instruction. Scope In: 8:49:36 AM Scope Out: 8:59:27 AM Scope Withdrawal Time: 0 hours 5 minutes 48 seconds  Total Procedure Duration: 0 hours 9 minutes 51 seconds  Findings:                 The entire examined colon appeared normal on direct  and retroflexion views. Complications:            No immediate complications. Estimated blood loss:                            None. Estimated Blood Loss:     Estimated blood loss: none. Impression:               - The entire examined colon is normal on direct and                            retroflexion views.                           - No significant hemorrhoids                           - Rash in gluteal fold consistent with fungal                            origin. Recommendation:           - Repeat colonoscopy in 5 years for surveillance.                           - Patient has a contact number available for                            emergencies. The signs and symptoms of potential                            delayed complications were discussed with the                            patient. Return to normal activities  tomorrow.                            Written discharge instructions were provided to the                            patient.                           - Resume previous diet.                           - Continue present medications.                           - Pick up over-the-counter antifungal cream and                            apply to affected area twice daily for 2 weeks. If                            the problem persist, see your PCP or dermatologist Docia Chuck. Henrene Pastor, MD 05/27/2019 9:06:25 AM This report has been signed electronically.

## 2019-05-27 NOTE — Progress Notes (Signed)
PT taken to PACU. Monitors in place. VSS. Report given to RN. 

## 2019-05-27 NOTE — Patient Instructions (Signed)
Thank you for allowing [place] to care for you today!  Resume previous diet and medications today.  Pick up over-the-counter anti-fungal cream and apply twice daily to affected area.  If problems persists, see your PCP or dermatologist.  Return to your normal activities tomorrow.  Recommend next screening colonoscopy in 5 years for surveillance.      YOU HAD AN ENDOSCOPIC PROCEDURE TODAY AT [facility]:   Refer to the procedure report that was given to you for any specific questions about what was found during the examination.  If the procedure report does not answer your questions, please call your gastroenterologist to clarify.  If you requested that your care partner not be given the details of your procedure findings, then the procedure report has been included in a sealed envelope for you to review at your convenience later.  YOU SHOULD EXPECT: Some feelings of bloating in the abdomen. Passage of more gas than usual.  Walking can help get rid of the air that was put into your GI tract during the procedure and reduce the bloating. If you had a lower endoscopy (such as a colonoscopy or flexible sigmoidoscopy) you may notice spotting of blood in your stool or on the toilet paper. If you underwent a bowel prep for your procedure, you may not have a normal bowel movement for a few days.  Please Note:  You might notice some irritation and congestion in your nose or some drainage.  This is from the oxygen used during your procedure.  There is no need for concern and it should clear up in a day or so.  SYMPTOMS TO REPORT IMMEDIATELY:   Following lower endoscopy (colonoscopy or flexible sigmoidoscopy):  Excessive amounts of blood in the stool  Significant tenderness or worsening of abdominal pains  Swelling of the abdomen that is new, acute  Fever of 100F or higher   For urgent or emergent issues, a gastroenterologist can be reached at any hour by calling [phone]  [phone].   DIET:  We do recommend a small meal at first, but then you may proceed to your regular diet.  Drink plenty of fluids but you should avoid alcoholic beverages for 24 hours.  ACTIVITY:  You should plan to take it easy for the rest of today and you should NOT DRIVE or use heavy machinery until tomorrow (because of the sedation medicines used during the test).    FOLLOW UP: Our staff will call the number listed on your records 48-72 hours following your procedure to check on you and address any questions or concerns that you may have regarding the information given to you following your procedure. If we do not reach you, we will leave a message.  We will attempt to reach you two times.  During this call, we will ask if you have developed any symptoms of COVID 19. If you develop any symptoms (ie: fever, flu-like symptoms, shortness of breath, cough etc.) before then, please call [phone].  If you test positive for Covid 19 in the 2 weeks post procedure, please call and report this information to [place].    If any biopsies were taken you will be contacted by phone or by letter within the next 1-3 weeks.  Please call us at [phone] if you have not heard about the biopsies in 3 weeks.    SIGNATURES/CONFIDENTIALITY: You and/or your care partner have signed paperwork which will be entered into your electronic medical record.  These signatures attest to  the fact that that the information above on your After Visit Summary has been reviewed and is understood.  Full responsibility of the confidentiality of this discharge information lies with you and/or your care-partner.

## 2019-05-27 NOTE — Progress Notes (Signed)
Pt's states no medical or surgical changes since previsit or office visit.  TEMP KA  VITALS  CWPt's states no medical or surgical changes since previsit or office visit.

## 2019-05-29 ENCOUNTER — Telehealth: Payer: Self-pay

## 2019-05-29 NOTE — Telephone Encounter (Signed)
Second post procedure follow up call, no answer 

## 2019-05-29 NOTE — Telephone Encounter (Signed)
LVM

## 2020-04-07 ENCOUNTER — Other Ambulatory Visit: Payer: Self-pay | Admitting: Surgery

## 2020-05-07 ENCOUNTER — Other Ambulatory Visit: Payer: Self-pay | Admitting: Surgery

## 2020-07-23 ENCOUNTER — Other Ambulatory Visit: Payer: Self-pay

## 2020-07-23 ENCOUNTER — Ambulatory Visit
Admission: EM | Admit: 2020-07-23 | Discharge: 2020-07-23 | Disposition: A | Discharge status: Home / Self Care | Payer: BC Managed Care – PPO | Attending: Emergency Medicine | Admitting: Emergency Medicine

## 2020-07-23 DIAGNOSIS — Z20822 Contact with and (suspected) exposure to covid-19: Secondary | ICD-10-CM | POA: Diagnosis not present

## 2020-07-23 DIAGNOSIS — J22 Unspecified acute lower respiratory infection: Secondary | ICD-10-CM | POA: Diagnosis not present

## 2020-07-23 MED ORDER — BENZONATATE 200 MG PO CAPS: 200.0000 mg | ORAL_CAPSULE | Freq: Three times a day (TID) | ORAL | 0 refills | Status: AC | PRN

## 2020-07-23 MED ORDER — HYDROCODONE-HOMATROPINE 5-1.5 MG/5ML PO SYRP: 5.0000 mL | ORAL_SOLUTION | Freq: Every evening | ORAL | 0 refills | Status: DC | PRN

## 2020-07-23 MED ORDER — AMOXICILLIN-POT CLAVULANATE 875-125 MG PO TABS: 1.0000 | ORAL_TABLET | Freq: Two times a day (BID) | ORAL | 0 refills | Status: DC

## 2020-07-23 MED ORDER — PREDNISONE 20 MG PO TABS: 40.0000 mg | ORAL_TABLET | Freq: Every day | ORAL | 0 refills | Status: AC

## 2020-07-23 NOTE — ED Triage Notes (Signed)
Pt c/o cough, sore throat, and nasal congestion since last Friday. States coughing so hard her ribs are now hurting.

## 2020-07-23 NOTE — ED Provider Notes (Signed)
[name]    [name]: [id number] Arrival date & time: 07/23/20  7322      History   Chief Complaint Chief Complaint  Patient presents with  . Cough    HPI Jessica Macdonald is a 64 y.o. female history of seasonal allergies presenting today for evaluation of URI symptoms.  Reports that she has had cough congestion and sore throat.  Symptoms have been going on for approximately 1 week.  Reports ribs have become sore due to frequent coughing.  Reports very poor sleep last night due to cough.  Also reports continued headache and sinus pressure.  Husband with similar symptoms.  No known Covid exposure.  HPI  Past Medical History:  Diagnosis Date  . Allergy    seasonal  . Cancer ([place])    melanoma    There are no problems to display for this patient.   Past Surgical History:  Procedure Laterality Date  . carpel tunnel    . CHOLECYSTECTOMY     2017 February  . MELANOMA EXCISION  2009   right upper arm    OB History   No obstetric history on file.      Home Medications    Prior to Admission medications   Medication Sig Start Date End Date Taking? Authorizing Provider  amoxicillin-clavulanate (AUGMENTIN) 875-125 MG tablet Take 1 tablet by mouth every 12 (twelve) hours. 07/23/20   Raiya Stainback C, PA-C  b complex vitamins tablet Take 1 tablet by mouth daily.    [provider]  benzonatate (TESSALON) 200 MG capsule Take 1 capsule (200 mg total) by mouth 3 (three) times daily as needed for up to 7 days for cough. 07/23/20 07/30/20  Emmakate Hypes C, PA-C  fish oil-omega-3 fatty acids 1000 MG capsule Take 1 g by mouth daily.     [provider]  HYDROcodone-homatropine (HYCODAN) 5-1.5 MG/5ML syrup Take 5 mLs by mouth at bedtime as needed for cough. 07/23/20   Trula Frede C, PA-C  Multiple Vitamins-Minerals (ONE-A-DAY VITACRAVES ADULT) CHEW Chew by mouth.    [provider]  predniSONE (DELTASONE) 20 MG tablet Take 2 tablets (40 mg  total) by mouth daily with breakfast for 5 days. 07/23/20 07/28/20  Tobie Hellen C, PA-C  Probiotic Product (PROBIOTIC & ACIDOPHILUS EX ST PO) Take 1 tablet by mouth daily.    [provider]    Family History Family History  Problem Relation Age of Onset  . Colon cancer Mother   . Colon cancer Paternal Grandmother   . Heart disease Father     Social History Social History   Tobacco Use  . Smoking status: Never Smoker  . Smokeless tobacco: Never Used  Substance Use Topics  . Alcohol use: Yes    Alcohol/week: 1.0 standard drink    Types: 1 Glasses of wine per week  . Drug use: No     Allergies   Patient has no known allergies.   Review of Systems Review of Systems  Constitutional: Positive for fatigue. Negative for activity change, appetite change, chills and fever.  HENT: Positive for congestion, rhinorrhea, sinus pressure and sore throat. Negative for ear pain and trouble swallowing.   Eyes: Negative for discharge and redness.  Respiratory: Positive for cough. Negative for chest tightness and shortness of breath.   Cardiovascular: Positive for chest pain.  Gastrointestinal: Negative for abdominal pain, diarrhea, nausea and vomiting.  Musculoskeletal: Negative for myalgias.  Skin: Negative for rash.  Neurological: Negative for dizziness, light-headedness and  headaches.     Physical Exam Triage Vital Signs ED Triage Vitals [07/23/20 0841]  Enc Vitals Group     BP 118/76     Pulse Rate 96     Resp 18     Temp 98.1 F (36.7 C)     Temp Source Oral     SpO2 96 %     Weight      Height      Head Circumference      Peak Flow      Pain Score 8     Pain Loc      Pain Edu?      Excl. in [place]?    No data found.  Updated Vital Signs BP 118/76 (BP Location: Left Arm)   Pulse 96   Temp 98.1 F (36.7 C) (Oral)   Resp 18   SpO2 96%   Visual Acuity Right Eye Distance:   Left Eye Distance:   Bilateral Distance:    Right Eye Near:   Left Eye Near:     Bilateral Near:     Physical Exam Vitals and nursing note reviewed.  Constitutional:      Appearance: She is well-developed.     Comments: No acute distress  HENT:     Head: Normocephalic and atraumatic.     Ears:     Comments: Bilateral ears without tenderness to palpation of external auricle, tragus and mastoid, EAC's without erythema or swelling, TM's with good bony landmarks and cone of light. Non erythematous.     Nose: Nose normal.     Mouth/Throat:     Comments: Oral mucosa pink and moist, no tonsillar enlargement or exudate. Posterior pharynx patent and nonerythematous, no uvula deviation or swelling. Normal phonation. Eyes:     Conjunctiva/sclera: Conjunctivae normal.  Cardiovascular:     Rate and Rhythm: Normal rate.  Pulmonary:     Effort: Pulmonary effort is normal. No respiratory distress.     Comments: Breathing comfortably at rest, slightly coarse breath sounds, no wheezing, rales or other adventitious sounds auscultated Abdominal:     General: There is no distension.  Musculoskeletal:        General: Normal range of motion.     Cervical back: Neck supple.  Skin:    General: Skin is warm and dry.  Neurological:     Mental Status: She is alert and oriented to person, place, and time.      UC Treatments / Results  Labs (all labs ordered are listed, but only abnormal results are displayed) Labs Reviewed  NOVEL CORONAVIRUS, NAA    EKG   Radiology No results found.  Procedures Procedures (including critical care time)  Medications Ordered in UC Medications - No data to display  Initial Impression / Assessment and Plan / UC Course  I have reviewed the triage vital signs and the nursing notes.  Pertinent labs & imaging results that were available during my care of the patient were reviewed by me and considered in my medical decision making (see chart for details).     URI symptoms greater than 1 week, breath sounds slightly coarse, will go  ahead and treat with Augmentin to cover for sinusitis as well as infection in lungs, prednisone to help with coarseness and inflammation and hopefully prevent developing more into bronchitis.  Tessalon for daytime cough, Hycodan for nighttime cough.  Continue to rest and fluids.  Covid test pending.  Discussed strict return precautions. Patient verbalized understanding and is agreeable  with plan.  Final Clinical Impressions(s) / UC Diagnoses   Final diagnoses:  Encounter for screening laboratory testing for COVID-19 virus  Lower respiratory infection (e.g., bronchitis, pneumonia, pneumonitis, pulmonitis)     Discharge Instructions     Covid test pending, monitor my chart for results Begin Augmentin twice daily for 1 week Prednisone 40 mg daily for the next 5 days to help with inflammation in sinuses and lungs Tessalon/benzonatate every 8 hours as needed for daytime cough May use Hycodan cough syrup for nighttime cough May continue over-the-counter congestion medicines as needed and ibuprofen/Advil Rest and fluids Follow-up if not improving or worsening    ED Prescriptions    Medication Sig Dispense Auth. Provider   amoxicillin-clavulanate (AUGMENTIN) 875-125 MG tablet Take 1 tablet by mouth every 12 (twelve) hours. 14 tablet Kodey Xue C, PA-C   predniSONE (DELTASONE) 20 MG tablet Take 2 tablets (40 mg total) by mouth daily with breakfast for 5 days. 10 tablet Shaunna Rosetti C, PA-C   benzonatate (TESSALON) 200 MG capsule Take 1 capsule (200 mg total) by mouth 3 (three) times daily as needed for up to 7 days for cough. 28 capsule Shreeya Recendiz C, PA-C   HYDROcodone-homatropine (HYCODAN) 5-1.5 MG/5ML syrup Take 5 mLs by mouth at bedtime as needed for cough. 75 mL Inari Shin, [place] C, PA-C     PDMP not reviewed this encounter.   Janith Lima, [place] 07/23/20 [id number]

## 2020-07-23 NOTE — Discharge Instructions (Signed)
Covid test pending, monitor my chart for results Begin Augmentin twice daily for 1 week Prednisone 40 mg daily for the next 5 days to help with inflammation in sinuses and lungs Tessalon/benzonatate every 8 hours as needed for daytime cough May use Hycodan cough syrup for nighttime cough May continue over-the-counter congestion medicines as needed and ibuprofen/Advil Rest and fluids Follow-up if not improving or worsening

## 2020-07-24 LAB — SARS-COV-2, NAA 2 DAY TAT

## 2020-07-24 LAB — NOVEL CORONAVIRUS, NAA: SARS-CoV-2, NAA: NOT DETECTED

## 2020-09-16 ENCOUNTER — Encounter: Payer: Self-pay | Admitting: Emergency Medicine

## 2020-09-16 ENCOUNTER — Other Ambulatory Visit: Payer: Self-pay

## 2020-09-16 ENCOUNTER — Ambulatory Visit
Admission: EM | Admit: 2020-09-16 | Discharge: 2020-09-16 | Disposition: A | Discharge status: Home / Self Care | Payer: 59 | Attending: Urgent Care | Admitting: Urgent Care

## 2020-09-16 DIAGNOSIS — R059 Cough, unspecified: Secondary | ICD-10-CM

## 2020-09-16 DIAGNOSIS — R0982 Postnasal drip: Secondary | ICD-10-CM

## 2020-09-16 DIAGNOSIS — Z1152 Encounter for screening for COVID-19: Secondary | ICD-10-CM

## 2020-09-16 DIAGNOSIS — B349 Viral infection, unspecified: Secondary | ICD-10-CM

## 2020-09-16 MED ORDER — PSEUDOEPHEDRINE HCL 60 MG PO TABS: 60.0000 mg | ORAL_TABLET | Freq: Three times a day (TID) | ORAL | 0 refills | Status: DC | PRN

## 2020-09-16 MED ORDER — PROMETHAZINE-DM 6.25-15 MG/5ML PO SYRP: 5.0000 mL | ORAL_SOLUTION | Freq: Every evening | ORAL | 0 refills | Status: DC | PRN

## 2020-09-16 MED ORDER — BENZONATATE 100 MG PO CAPS: 100.0000 mg | ORAL_CAPSULE | Freq: Three times a day (TID) | ORAL | 0 refills | Status: DC | PRN

## 2020-09-16 MED ORDER — CETIRIZINE HCL 10 MG PO TABS: 10.0000 mg | ORAL_TABLET | Freq: Every day | ORAL | 0 refills | Status: DC

## 2020-09-16 NOTE — ED Provider Notes (Signed)
[place]   MRN: [id number] DOB: 1956-04-26  Subjective:   Jessica Macdonald is a 65 y.o. female presenting for 1 day history of cute onset cough, congestion, postnasal drainage, sinus headache, body aches.  Denies fever, chest pain, shortness of breath.  Patient has a history of allergies, states that she just got over bronchitis this past month.  She is Covid vaccinated, no booster.  Denies smoking.  No history of asthma or respiratory disorders.  No current facility-administered medications for this encounter.  Current Outpatient Medications:  .  amoxicillin-clavulanate (AUGMENTIN) 875-125 MG tablet, Take 1 tablet by mouth every 12 (twelve) hours., Disp: 14 tablet, Rfl: 0 .  b complex vitamins tablet, Take 1 tablet by mouth daily., Disp: , Rfl:  .  fish oil-omega-3 fatty acids 1000 MG capsule, Take 1 g by mouth daily. , Disp: , Rfl:  .  HYDROcodone-homatropine (HYCODAN) 5-1.5 MG/5ML syrup, Take 5 mLs by mouth at bedtime as needed for cough., Disp: 75 mL, Rfl: 0 .  Multiple Vitamins-Minerals (ONE-A-DAY VITACRAVES ADULT) CHEW, Chew by mouth., Disp: , Rfl:  .  Probiotic Product (PROBIOTIC & ACIDOPHILUS EX ST PO), Take 1 tablet by mouth daily., Disp: , Rfl:    No Known Allergies  Past Medical History:  Diagnosis Date  . Allergy    seasonal  . Cancer ([place])    melanoma     Past Surgical History:  Procedure Laterality Date  . carpel tunnel    . CHOLECYSTECTOMY     2017 February  . MELANOMA EXCISION  2009   right upper arm    Family History  Problem Relation Age of Onset  . Colon cancer Mother   . Colon cancer Paternal Grandmother   . Heart disease Father     Social History   Tobacco Use  . Smoking status: Never Smoker  . Smokeless tobacco: Never Used  Substance Use Topics  . Alcohol use: Yes    Alcohol/week: 1.0 standard drink    Types: 1 Glasses of wine per week  . Drug use: No    ROS   Objective:   Vitals: BP 139/72 (BP Location: Right Arm)    Pulse (!) 106   Temp 98.1 F (36.7 C) (Oral)   Resp 16   SpO2 99%   Physical Exam Constitutional:      General: She is not in acute distress.    Appearance: Normal appearance. She is well-developed. She is not ill-appearing, toxic-appearing or diaphoretic.  HENT:     Head: Normocephalic and atraumatic.     Nose: Nose normal.     Mouth/Throat:     Mouth: Mucous membranes are moist.  Eyes:     Extraocular Movements: Extraocular movements intact.     Pupils: Pupils are equal, round, and reactive to light.  Cardiovascular:     Rate and Rhythm: Regular rhythm. Tachycardia present.     Pulses: Normal pulses.     Heart sounds: Normal heart sounds. No murmur heard. No friction rub. No gallop.      Comments: Borderline tachycardia. Pulmonary:     Effort: Pulmonary effort is normal. No respiratory distress.     Breath sounds: Normal breath sounds. No stridor. No wheezing, rhonchi or rales.  Skin:    General: Skin is warm and dry.     Findings: No rash.  Neurological:     Mental Status: She is alert and oriented to person, place, and time.  Psychiatric:        Mood  and Affect: Mood normal.        Behavior: Behavior normal.        Thought Content: Thought content normal.      Assessment and Plan :   PDMP not reviewed this encounter.  1. Viral syndrome   2. Encounter for screening for COVID-19   3. Cough   4. Post-nasal drainage     High suspicion for COVID-19, labs pending.  Recommend supportive care. Counseled patient on potential for adverse effects with medications prescribed/recommended today, ER and return-to-clinic precautions discussed, patient verbalized understanding.    Jaynee Eagles, PA-C 09/16/20 1753

## 2020-09-16 NOTE — Discharge Instructions (Signed)

## 2020-09-16 NOTE — ED Triage Notes (Signed)
Pt said she started feeling different yesterday and then today the congestion and cough hit her quickly, headache, and body aches. No fevers, chills

## 2020-09-18 LAB — SARS-COV-2, NAA 2 DAY TAT

## 2020-09-18 LAB — NOVEL CORONAVIRUS, NAA: SARS-CoV-2, NAA: DETECTED — AB

## 2020-09-19 ENCOUNTER — Telehealth: Payer: Self-pay | Admitting: Family

## 2020-09-19 NOTE — Telephone Encounter (Signed)
Called to discuss with patient about COVID-19 symptoms and the use of one of the available treatments for those with mild to moderate Covid symptoms and at a high risk of hospitalization.  Pt appears to qualify for outpatient treatment due to co-morbid conditions and/or a member of an at-risk group in accordance with the FDA Emergency Use Authorization.     Unable to reach pt.   Jessica Macdonald   

## 2021-01-26 ENCOUNTER — Other Ambulatory Visit: Payer: Self-pay | Admitting: Family Medicine

## 2021-01-26 NOTE — Progress Notes (Signed)
65yo F w/ Marked HLD w/ FMHx of CAD/MI in Father at age 29.

## 2021-02-15 ENCOUNTER — Ambulatory Visit
Admission: RE | Admit: 2021-02-15 | Discharge: 2021-02-15 | Disposition: A | Discharge status: Home / Self Care | Payer: No Typology Code available for payment source | Source: Ambulatory Visit | Attending: Family Medicine | Admitting: Family Medicine

## 2021-03-08 ENCOUNTER — Telehealth: Payer: Self-pay | Admitting: Family Medicine

## 2021-03-08 NOTE — Telephone Encounter (Signed)
Ok with me 

## 2021-03-08 NOTE — Telephone Encounter (Signed)
Patient is requesting a transfer of care to Dr. Jenny Reichmann.   Please advise.

## 2021-04-05 ENCOUNTER — Encounter: Payer: No Typology Code available for payment source | Admitting: Internal Medicine

## 2021-05-05 ENCOUNTER — Other Ambulatory Visit: Payer: Self-pay

## 2021-05-05 ENCOUNTER — Encounter: Payer: Self-pay | Admitting: Internal Medicine

## 2021-05-05 ENCOUNTER — Ambulatory Visit: Payer: 59 | Admitting: Internal Medicine

## 2021-05-05 VITALS — BP 110/70 | HR 84 | Temp 98.1°F | Ht 63.0 in | Wt 200.0 lb

## 2021-05-05 DIAGNOSIS — R931 Abnormal findings on diagnostic imaging of heart and coronary circulation: Secondary | ICD-10-CM | POA: Diagnosis not present

## 2021-05-05 DIAGNOSIS — Z0001 Encounter for general adult medical examination with abnormal findings: Secondary | ICD-10-CM | POA: Insufficient documentation

## 2021-05-05 DIAGNOSIS — E7800 Pure hypercholesterolemia, unspecified: Secondary | ICD-10-CM

## 2021-05-05 DIAGNOSIS — T7840XD Allergy, unspecified, subsequent encounter: Secondary | ICD-10-CM | POA: Diagnosis not present

## 2021-05-05 DIAGNOSIS — E559 Vitamin D deficiency, unspecified: Secondary | ICD-10-CM

## 2021-05-05 DIAGNOSIS — R739 Hyperglycemia, unspecified: Secondary | ICD-10-CM

## 2021-05-05 DIAGNOSIS — E785 Hyperlipidemia, unspecified: Secondary | ICD-10-CM | POA: Insufficient documentation

## 2021-05-05 DIAGNOSIS — C439 Malignant melanoma of skin, unspecified: Secondary | ICD-10-CM

## 2021-05-05 DIAGNOSIS — E538 Deficiency of other specified B group vitamins: Secondary | ICD-10-CM

## 2021-05-05 DIAGNOSIS — Z8616 Personal history of COVID-19: Secondary | ICD-10-CM | POA: Insufficient documentation

## 2021-05-05 DIAGNOSIS — T7840XA Allergy, unspecified, initial encounter: Secondary | ICD-10-CM | POA: Insufficient documentation

## 2021-05-05 DIAGNOSIS — Z800 Family history of malignant neoplasm of digestive organs: Secondary | ICD-10-CM

## 2021-05-05 HISTORY — DX: Malignant melanoma of skin, unspecified: C43.9

## 2021-05-05 HISTORY — DX: Abnormal findings on diagnostic imaging of heart and coronary circulation: R93.1

## 2021-05-05 HISTORY — DX: Hyperlipidemia, unspecified: E78.5

## 2021-05-05 HISTORY — DX: Family history of malignant neoplasm of digestive organs: Z80.0

## 2021-05-05 NOTE — Progress Notes (Signed)
Patient ID: Jessica Macdonald, female   DOB: May 04, 1956, 65 y.o.   MRN: [id number]         Chief Complaint:: wellness exam and Transfer of Care  Hld, elevated cor calcium score,        HPI:  Jessica Macdonald is a 65 y.o. female here for wellness exam; declines flu shot, covid boostr, shingrix, tdap, hep C and HIV screen, o/w up to date with preventive referrals and immunizations                        Also not wanting statin as retiring in November, knows goal LD Is < 70 and plans to repeat next yr given barely abnormal car calcium score (0.5).  Pt denies chest pain, increased sob or doe, wheezing, orthopnea, PND, increased LE swelling, palpitations, dizziness or syncope.   Pt denies polydipsia, polyuria, or new focal neuro s/s.   Pt denies fever, wt loss, night sweats, loss of appetite, or other constitutional symptoms  No other new complaints   Wt Readings from Last 3 Encounters:  05/05/21 200 lb (90.7 kg)  05/27/19 200 lb (90.7 kg)  05/24/19 200 lb 6.4 oz (90.9 kg)   BP Readings from Last 3 Encounters:  05/05/21 110/70  09/16/20 139/72  07/23/20 118/76   Immunization History  Administered Date(s) Administered   Moderna Sars-Covid-2 Vaccination 11/15/2019, 12/16/2019   There are no preventive care reminders to display for this patient.     Past Medical History:  Diagnosis Date   Allergy    seasonal   Cancer ([place])    melanoma   Elevated coronary artery calcium score 05/05/2021   Family history of colon cancer 05/05/2021   HLD (hyperlipidemia) 05/05/2021   Melanoma ([place]) 05/05/2021   Past Surgical History:  Procedure Laterality Date   carpel tunnel     CHOLECYSTECTOMY     2017 February   MELANOMA EXCISION  2009   right upper arm    reports that she has never smoked. She has been exposed to tobacco smoke. She has never used smokeless tobacco. She reports current alcohol use of about 1.0 standard drink per week. She reports that she does not use drugs. family history includes  Colon cancer in her mother and paternal grandmother; Heart disease in her father. No Known Allergies Current Outpatient Medications on File Prior to Visit  Medication Sig Dispense Refill   b complex vitamins tablet Take 1 tablet by mouth daily.     cetirizine (ZYRTEC ALLERGY) 10 MG tablet Take 1 tablet (10 mg total) by mouth daily. 30 tablet 0   ibuprofen (ADVIL) 200 MG tablet Take 200 mg by mouth every 6 (six) hours as needed.     Multiple Vitamins-Minerals (ONE-A-DAY VITACRAVES ADULT) CHEW Chew by mouth.     Probiotic Product (PROBIOTIC & ACIDOPHILUS EX ST PO) Take 1 tablet by mouth daily.     fish oil-omega-3 fatty acids 1000 MG capsule Take 1 g by mouth daily.  (Patient not taking: Reported on 05/05/2021)     No current facility-administered medications on file prior to visit.        ROS:  All others reviewed and negative.  Objective        PE:  BP 110/70 (BP Location: Right Arm, Patient Position: Sitting, Cuff Size: Large)   Pulse 84   Temp 98.1 F (36.7 C) (Oral)   Ht '5\' 3"'$  (1.6 m)   Wt 200 lb (90.7 kg)  SpO2 95%   BMI 35.43 kg/m                 Constitutional: Pt appears in NAD               HENT: Head: NCAT.                Right Ear: External ear normal.                 Left Ear: External ear normal.                Eyes: . Pupils are equal, round, and reactive to light. Conjunctivae and EOM are normal               Nose: without d/c or deformity               Neck: Neck supple. Gross normal ROM               Cardiovascular: Normal rate and regular rhythm.                 Pulmonary/Chest: Effort normal and breath sounds without rales or wheezing.                Abd:  Soft, NT, ND, + BS, no organomegaly               Neurological: Pt is alert. At baseline orientation, motor grossly intact               Skin: Skin is warm. No rashes, no other new lesions, LE edema - none               Psychiatric: Pt behavior is normal without agitation   Micro: none  Cardiac tracings  I have personally interpreted today:  none  Pertinent Radiological findings (summarize): none   Lab Results  Component Value Date   WBC 10.3 08/31/2015   HGB 12.7 08/31/2015   HCT 40.0 08/31/2015   PLT 296 08/31/2015   GLUCOSE 103 (H) 08/31/2015   ALT 37 08/31/2015   AST 50 (H) 08/31/2015   NA 139 08/31/2015   K 4.6 08/31/2015   CL 102 08/31/2015   CREATININE 0.77 08/31/2015   BUN 18 08/31/2015   CO2 28 08/31/2015   Assessment/Plan:  Jessica Macdonald is a 65 y.o. White or Caucasian [1] female with  has a past medical history of Allergy, Cancer ([place]), Elevated coronary artery calcium score (05/05/2021), Family history of colon cancer (05/05/2021), HLD (hyperlipidemia) (05/05/2021), and Melanoma ([place]) (05/05/2021).  Encounter for well adult exam with abnormal findings Age and sex appropriate education and counseling updated with regular exercise and diet Referrals for preventative services - declines hep c and hiv screen Immunizations addressed - declines flu shot, covid booster, shingrix, tdap for now Smoking counseling  - none needed Evidence for depression or other mood disorder - none significant Most recent labs reviewed. I have personally reviewed and have noted: 1) the patient's medical and social history 2) The patient's current medications and supplements 3) The patient's height, weight, and BMI have been recorded in the chart   Elevated coronary artery calcium score Extremely minimal at 0.5 by CT scoring 02/15/2021, goal LDL < 70, but given testing pt declines statin for now  HLD (hyperlipidemia) Last LDL by GYN labs is 136 in June 2022; goal LDL < 70, pt declines statin for now, for lower chol diet   Allergy Stable overal, to continue anthistamine  tx prn  Followup: Return in about 1 year (around 05/05/2022).  Cathlean Cower, MD 05/10/2021 10:45 AM [facility]

## 2021-05-05 NOTE — Patient Instructions (Addendum)
Please consider the Novavax covid vaccine in oct 2022  Please consider the Shingrix vaccine before 65yo  Please continue all other medications as before, and refills have been done if requested.  Please have the pharmacy call with any other refills you may need.  Please continue your efforts at being more active, low cholesterol diet, and weight control.  You are otherwise up to date with prevention measures today.  Please keep your appointments with your specialists as you may have planned  Please make an Appointment to return for your 1 year visit, or sooner if needed, with Lab testing by Appointment as well, to be done about 3-5 days before at the [facility] (so this is for TWO appointments - please see the scheduling desk as you leave)  Due to the ongoing Covid 19 pandemic, our lab now requires an appointment for any labs done at our office.  If you need labs done and do not have an appointment, please call our office ahead of time to schedule before presenting to the lab for your testing.

## 2021-05-10 ENCOUNTER — Encounter: Payer: Self-pay | Admitting: Internal Medicine

## 2021-05-10 NOTE — Assessment & Plan Note (Signed)
Stable overal, to continue anthistamine tx prn

## 2021-05-10 NOTE — Assessment & Plan Note (Signed)
Last LDL by GYN labs is 136 in June 2022; goal LDL < 70, pt declines statin for now, for lower chol diet

## 2021-05-10 NOTE — Assessment & Plan Note (Signed)
Extremely minimal at 0.5 by CT scoring 02/15/2021, goal LDL < 70, but given testing pt declines statin for now

## 2021-05-10 NOTE — Assessment & Plan Note (Signed)
Age and sex appropriate education and counseling updated with regular exercise and diet Referrals for preventative services - declines hep c and hiv screen Immunizations addressed - declines flu shot, covid booster, shingrix, tdap for now Smoking counseling  - none needed Evidence for depression or other mood disorder - none significant Most recent labs reviewed. I have personally reviewed and have noted: 1) the patient's medical and social history 2) The patient's current medications and supplements 3) The patient's height, weight, and BMI have been recorded in the chart

## 2021-06-08 ENCOUNTER — Ambulatory Visit
Admission: EM | Admit: 2021-06-08 | Discharge: 2021-06-08 | Disposition: A | Discharge status: Home / Self Care | Payer: 59 | Attending: Internal Medicine | Admitting: Internal Medicine

## 2021-06-08 ENCOUNTER — Other Ambulatory Visit: Payer: Self-pay

## 2021-06-08 ENCOUNTER — Encounter: Payer: Self-pay | Admitting: Emergency Medicine

## 2021-06-08 DIAGNOSIS — R051 Acute cough: Secondary | ICD-10-CM

## 2021-06-08 DIAGNOSIS — Z20822 Contact with and (suspected) exposure to covid-19: Secondary | ICD-10-CM

## 2021-06-08 DIAGNOSIS — J069 Acute upper respiratory infection, unspecified: Secondary | ICD-10-CM | POA: Diagnosis not present

## 2021-06-08 MED ORDER — PREDNISONE 20 MG PO TABS: 40.0000 mg | ORAL_TABLET | Freq: Every day | ORAL | 0 refills | Status: AC

## 2021-06-08 MED ORDER — AZITHROMYCIN 500 MG PO TABS: ORAL_TABLET | ORAL | 0 refills | Status: AC

## 2021-06-08 NOTE — ED Triage Notes (Signed)
Cough, nasal congestion, and generalized body aches starting Sunday. Has been treating with advil cold/sinus, and cough drops

## 2021-06-08 NOTE — Discharge Instructions (Addendum)
Your COVID-19 test is pending.  We will call if it is positive.  You have prescribed an antibiotic and prednisone steroid to decrease symptoms.  Please take with food.  Follow-up if symptoms persist.

## 2021-06-08 NOTE — ED Provider Notes (Signed)
[name]    [name]: [id number] Arrival date & time: 06/08/21  1118      History   Chief Complaint Chief Complaint  Patient presents with   Cough   Nasal Congestion   Generalized Body Aches    HPI Jessica Macdonald is a 65 y.o. female.   Patient presents with 5 to 6-day history of cough, nasal congestion, body aches.  Cough is productive with green sputum.  Denies any known fevers or sick contacts.  Has been taking over-the-counter cold and sinus medication as well as cough drops with no improvement in symptoms.  Denies chest pain or shortness of breath.  Denies nausea, vomiting, abdominal pain, diarrhea.   Cough  Past Medical History:  Diagnosis Date   Allergy    seasonal   Cancer ([place])    melanoma   Elevated coronary artery calcium score 05/05/2021   Family history of colon cancer 05/05/2021   HLD (hyperlipidemia) 05/05/2021   Melanoma ([place]) 05/05/2021    Patient Active Problem List   Diagnosis Date Noted   History of COVID-19 05/05/2021   Allergy 05/05/2021   Family history of colon cancer 05/05/2021   Melanoma ([place]) 05/05/2021   Encounter for well adult exam with abnormal findings 05/05/2021   Elevated coronary artery calcium score 05/05/2021   HLD (hyperlipidemia) 05/05/2021    Past Surgical History:  Procedure Laterality Date   carpel tunnel     CHOLECYSTECTOMY     2017 February   MELANOMA EXCISION  2009   right upper arm    OB History   No obstetric history on file.      Home Medications    Prior to Admission medications   Medication Sig Start Date End Date Taking? Authorizing Provider  azithromycin (ZITHROMAX) 500 MG tablet Take 1 tablet (500 mg total) by mouth daily for 1 day, THEN 0.5 tablets (250 mg total) daily for 4 days. 06/08/21 06/13/21 Yes Odis Luster, FNP  predniSONE (DELTASONE) 20 MG tablet Take 2 tablets (40 mg total) by mouth daily for 5 days. 06/08/21 06/13/21 Yes Odis Luster, FNP  b complex vitamins tablet  Take 1 tablet by mouth daily.    [provider]  cetirizine (ZYRTEC ALLERGY) 10 MG tablet Take 1 tablet (10 mg total) by mouth daily. 09/16/20   Jaynee Eagles, PA-C  fish oil-omega-3 fatty acids 1000 MG capsule Take 1 g by mouth daily.  Patient not taking: Reported on 05/05/2021    [provider]  ibuprofen (ADVIL) 200 MG tablet Take 200 mg by mouth every 6 (six) hours as needed.    [provider]  Multiple Vitamins-Minerals (ONE-A-DAY VITACRAVES ADULT) CHEW Chew by mouth.    [provider]  Probiotic Product (PROBIOTIC & ACIDOPHILUS EX ST PO) Take 1 tablet by mouth daily.    [provider]    Family History Family History  Problem Relation Age of Onset   Colon cancer Mother    Colon cancer Paternal Grandmother    Heart disease Father     Social History Social History   Tobacco Use   Smoking status: Never    Passive exposure: Past   Smokeless tobacco: Never  Vaping Use   Vaping Use: Never used  Substance Use Topics   Alcohol use: Yes    Alcohol/week: 1.0 standard drink    Types: 1 Glasses of wine per week    Comment: occasional   Drug use: Never     Allergies  Patient has no known allergies.   Review of Systems Review of Systems Per HPI  Physical Exam Triage Vital Signs ED Triage Vitals  Enc Vitals Group     BP 06/08/21 1213 120/78     Pulse Rate 06/08/21 1213 86     Resp 06/08/21 1213 16     Temp 06/08/21 1213 97.7 F (36.5 C)     Temp Source 06/08/21 1213 Oral     SpO2 06/08/21 1213 94 %     Weight --      Height --      Head Circumference --      Peak Flow --      Pain Score 06/08/21 1214 6     Pain Loc --      Pain Edu? --      Excl. in [place]? --    No data found.  Updated Vital Signs BP 120/78 (BP Location: Left Arm)   Pulse 86   Temp 97.7 F (36.5 C) (Oral)   Resp 16   SpO2 94%   Visual Acuity Right Eye Distance:   Left Eye Distance:   Bilateral Distance:    Right Eye Near:   Left Eye  Near:    Bilateral Near:     Physical Exam Constitutional:      General: She is not in acute distress.    Appearance: Normal appearance. She is not toxic-appearing or diaphoretic.  HENT:     Head: Normocephalic and atraumatic.     Right Ear: Ear canal normal. A middle ear effusion is present. Tympanic membrane is not erythematous or bulging.     Left Ear: Ear canal normal. A middle ear effusion is present. Tympanic membrane is not erythematous or bulging.     Nose: Congestion present.     Mouth/Throat:     Mouth: Mucous membranes are moist.     Pharynx: No posterior oropharyngeal erythema.  Eyes:     Extraocular Movements: Extraocular movements intact.     Conjunctiva/sclera: Conjunctivae normal.     Pupils: Pupils are equal, round, and reactive to light.  Cardiovascular:     Rate and Rhythm: Normal rate and regular rhythm.     Pulses: Normal pulses.     Heart sounds: Normal heart sounds.  Pulmonary:     Effort: Pulmonary effort is normal. No respiratory distress.     Breath sounds: Normal breath sounds. No stridor. No wheezing or rhonchi.  Abdominal:     General: Abdomen is flat. Bowel sounds are normal.     Palpations: Abdomen is soft.  Musculoskeletal:        General: Normal range of motion.     Cervical back: Normal range of motion.  Skin:    General: Skin is warm and dry.  Neurological:     General: No focal deficit present.     Mental Status: She is alert and oriented to person, place, and time. Mental status is at baseline.  Psychiatric:        Mood and Affect: Mood normal.        Behavior: Behavior normal.     UC Treatments / Results  Labs (all labs ordered are listed, but only abnormal results are displayed) Labs Reviewed  NOVEL CORONAVIRUS, NAA    EKG   Radiology No results found.  Procedures Procedures (including critical care time)  Medications Ordered in UC Medications - No data to display  Initial Impression / Assessment and Plan / UC Course   I  have reviewed the triage vital signs and the nursing notes.  Pertinent labs & imaging results that were available during my care of the patient were reviewed by me and considered in my medical decision making (see chart for details).     Will defer chest imaging as lung sounds are clear.  Will treat with azithromycin to cover atypicals as well as prednisone to decrease inflammation in chest.  Discussed over-the-counter cough medications with patient.  COVID-19 PCR pending.Discussed strict return precautions. Patient verbalized understanding and is agreeable with plan.  Final Clinical Impressions(s) / UC Diagnoses   Final diagnoses:  Acute upper respiratory infection  Acute cough  Encounter for laboratory testing for COVID-19 virus     Discharge Instructions      Your COVID-19 test is pending.  We will call if it is positive.  You have prescribed an antibiotic and prednisone steroid to decrease symptoms.  Please take with food.  Follow-up if symptoms persist.     ED Prescriptions     Medication Sig Dispense Auth. Provider   azithromycin (ZITHROMAX) 500 MG tablet Take 1 tablet (500 mg total) by mouth daily for 1 day, THEN 0.5 tablets (250 mg total) daily for 4 days. 3 tablet Odis Luster, FNP   predniSONE (DELTASONE) 20 MG tablet Take 2 tablets (40 mg total) by mouth daily for 5 days. 10 tablet Odis Luster, FNP      PDMP not reviewed this encounter.   Odis Luster, [place] 06/08/21 1256

## 2021-06-09 LAB — NOVEL CORONAVIRUS, NAA: SARS-CoV-2, NAA: NOT DETECTED

## 2021-06-09 LAB — SARS-COV-2, NAA 2 DAY TAT

## 2021-07-12 NOTE — Progress Notes (Signed)
Subjective:    Patient ID: Jessica Macdonald, female    DOB: Aug 11, 1956, 65 y.o.   MRN: [id number]  This visit occurred during the SARS-CoV-2 public health emergency.  Safety protocols were in place, including screening questions prior to the visit, additional usage of staff PPE, and extensive cleaning of exam room while observing appropriate contact time as indicated for disinfecting solutions.    HPI The patient is here for an acute visit persistent cough x weeks.  Went to [facility]  10/18 - cold symptoms, covid neg.  Dx with URI and prescribed zpak and prednisone x 5 days.  The medications helped, but she has had a persistent cough since.   Her cough is dry. It can be intense at times.  She feels PND.  The cough is worse when laying down and moving around.    She has tried zyrtec and tessalon pearles-she is not taking them consistently.     Medications and allergies reviewed with patient and updated if appropriate.  Patient Active Problem List   Diagnosis Date Noted   History of COVID-19 05/05/2021   Allergy 05/05/2021   Family history of colon cancer 05/05/2021   Melanoma ([place]) 05/05/2021   Encounter for well adult exam with abnormal findings 05/05/2021   Elevated coronary artery calcium score 05/05/2021   HLD (hyperlipidemia) 05/05/2021    Current Outpatient Medications on File Prior to Visit  Medication Sig Dispense Refill   b complex vitamins tablet Take 1 tablet by mouth daily.     cetirizine (ZYRTEC ALLERGY) 10 MG tablet Take 1 tablet (10 mg total) by mouth daily. 30 tablet 0   ibuprofen (ADVIL) 200 MG tablet Take 200 mg by mouth every 6 (six) hours as needed.     Multiple Vitamins-Minerals (ONE-A-DAY VITACRAVES ADULT) CHEW Chew by mouth.     Probiotic Product (PROBIOTIC & ACIDOPHILUS EX ST PO) Take 1 tablet by mouth daily.     fish oil-omega-3 fatty acids 1000 MG capsule Take 1 g by mouth daily.  (Patient not taking: Reported on 05/05/2021)     No current  facility-administered medications on file prior to visit.    Past Medical History:  Diagnosis Date   Allergy    seasonal   Cancer ([place])    melanoma   Elevated coronary artery calcium score 05/05/2021   Family history of colon cancer 05/05/2021   HLD (hyperlipidemia) 05/05/2021   Melanoma ([place]) 05/05/2021    Past Surgical History:  Procedure Laterality Date   carpel tunnel     CHOLECYSTECTOMY     2017 February   MELANOMA EXCISION  2009   right upper arm    Social History   Socioeconomic History   Marital status: Married    Spouse name: Not on file   Number of children: Not on file   Years of education: Not on file   Highest education level: Not on file  Occupational History   Not on file  Tobacco Use   Smoking status: Never    Passive exposure: Past   Smokeless tobacco: Never  Vaping Use   Vaping Use: Never used  Substance and Sexual Activity   Alcohol use: Yes    Alcohol/week: 1.0 standard drink    Types: 1 Glasses of wine per week    Comment: occasional   Drug use: Never   Sexual activity: Not on file  Other Topics Concern   Not on file  Social History Narrative   Not on file  Social Determinants of Health   Financial Resource Strain: Not on file  Food Insecurity: Not on file  Transportation Needs: Not on file  Physical Activity: Not on file  Stress: Not on file  Social Connections: Not on file    Family History  Problem Relation Age of Onset   Colon cancer Mother    Colon cancer Paternal Grandmother    Heart disease Father     Review of Systems  Constitutional:  Negative for chills and fever.  HENT:  Positive for congestion (mild) and postnasal drip. Negative for ear pain, sinus pressure, sinus pain and sore throat.   Respiratory:  Positive for cough. Negative for shortness of breath and wheezing.   Gastrointestinal:  Negative for abdominal pain and nausea.       Occ gerd  Neurological:  Negative for headaches.      Objective:   Vitals:    07/13/21 0802  BP: 106/82  Pulse: 87  Temp: 98.1 F (36.7 C)  SpO2: 99%   BP Readings from Last 3 Encounters:  07/13/21 106/82  06/08/21 120/78  05/05/21 110/70   Wt Readings from Last 3 Encounters:  07/13/21 201 lb (91.2 kg)  05/05/21 200 lb (90.7 kg)  05/27/19 200 lb (90.7 kg)   Body mass index is 35.61 kg/m.   Physical Exam    GENERAL APPEARANCE: Appears stated age, well appearing, NAD EYES: conjunctiva clear, no icterus HENT: bilateral tympanic membranes and ear canals normal, oropharynx with no erythema or exudates, trachea midline, no cervical or supraclavicular lymphadenopathy LUNGS: Unlabored breathing, good air entry bilaterally, clear to auscultation without wheeze or crackles CARDIOVASCULAR: Normal S1,S2 , no edema SKIN: Warm, dry      Assessment & Plan:    Cough Subacute Started after URI about 5-6 weeks ago and has been persistent Cough is dry and she has some postnasal drip and congestion as well No evidence of infection and I do not think additional antibiotics are needed Advised starting Zyrtec and taking regularly She would like to avoid steroids We will try an albuterol inhaler as needed-if this is not effective may need to consider a steroid inhaler or oral steroids  She will call if her symptoms or not improved.

## 2021-07-13 ENCOUNTER — Other Ambulatory Visit: Payer: Self-pay

## 2021-07-13 ENCOUNTER — Encounter: Payer: Self-pay | Admitting: Internal Medicine

## 2021-07-13 ENCOUNTER — Ambulatory Visit: Payer: 59 | Admitting: Internal Medicine

## 2021-07-13 VITALS — BP 106/82 | HR 87 | Temp 98.1°F | Ht 63.0 in | Wt 201.0 lb

## 2021-07-13 DIAGNOSIS — R052 Subacute cough: Secondary | ICD-10-CM | POA: Diagnosis not present

## 2021-07-13 MED ORDER — ALBUTEROL SULFATE HFA 108 (90 BASE) MCG/ACT IN AERS: 2.0000 | INHALATION_SPRAY | Freq: Four times a day (QID) | RESPIRATORY_TRACT | 0 refills | Status: DC | PRN

## 2021-07-13 NOTE — Patient Instructions (Addendum)
     Medications changes include :    use the albuterol inhaler as needed.  Take the zyrtec daily for at least 2 weeks.     Your prescription(s) have been submitted to your pharmacy. Please take as directed and contact our office if you believe you are having problem(s) with the medication(s).   Please call if there is no improvement in your symptoms.

## 2021-08-04 ENCOUNTER — Other Ambulatory Visit: Payer: Self-pay | Admitting: Internal Medicine

## 2022-03-04 DIAGNOSIS — D2262 Melanocytic nevi of left upper limb, including shoulder: Secondary | ICD-10-CM | POA: Diagnosis not present

## 2022-03-04 DIAGNOSIS — D2362 Other benign neoplasm of skin of left upper limb, including shoulder: Secondary | ICD-10-CM | POA: Diagnosis not present

## 2022-03-04 DIAGNOSIS — Z87898 Personal history of other specified conditions: Secondary | ICD-10-CM | POA: Diagnosis not present

## 2022-03-04 DIAGNOSIS — D2272 Melanocytic nevi of left lower limb, including hip: Secondary | ICD-10-CM | POA: Diagnosis not present

## 2022-03-04 DIAGNOSIS — D2271 Melanocytic nevi of right lower limb, including hip: Secondary | ICD-10-CM | POA: Diagnosis not present

## 2022-03-04 DIAGNOSIS — Z86018 Personal history of other benign neoplasm: Secondary | ICD-10-CM | POA: Diagnosis not present

## 2022-03-04 DIAGNOSIS — L821 Other seborrheic keratosis: Secondary | ICD-10-CM | POA: Diagnosis not present

## 2022-03-04 DIAGNOSIS — D225 Melanocytic nevi of trunk: Secondary | ICD-10-CM | POA: Diagnosis not present

## 2022-03-04 DIAGNOSIS — L578 Other skin changes due to chronic exposure to nonionizing radiation: Secondary | ICD-10-CM | POA: Diagnosis not present

## 2022-03-09 DIAGNOSIS — H5213 Myopia, bilateral: Secondary | ICD-10-CM | POA: Diagnosis not present

## 2022-03-09 DIAGNOSIS — H25013 Cortical age-related cataract, bilateral: Secondary | ICD-10-CM | POA: Diagnosis not present

## 2022-03-19 ENCOUNTER — Encounter: Payer: Self-pay | Admitting: Emergency Medicine

## 2022-03-19 ENCOUNTER — Ambulatory Visit
Admission: EM | Admit: 2022-03-19 | Discharge: 2022-03-19 | Disposition: A | Discharge status: Home / Self Care | Payer: Medicare HMO | Attending: Family Medicine | Admitting: Family Medicine

## 2022-03-19 DIAGNOSIS — J069 Acute upper respiratory infection, unspecified: Secondary | ICD-10-CM

## 2022-03-19 DIAGNOSIS — J029 Acute pharyngitis, unspecified: Secondary | ICD-10-CM | POA: Diagnosis not present

## 2022-03-19 MED ORDER — HYDROCODONE BIT-HOMATROP MBR 5-1.5 MG/5ML PO SOLN: 5.0000 mL | Freq: Four times a day (QID) | ORAL | 0 refills | Status: DC | PRN

## 2022-03-19 NOTE — ED Triage Notes (Signed)
Pt is present today with c/o sore throat ans nasal congestion. Pt sx started x3 days ago

## 2022-03-19 NOTE — ED Provider Notes (Signed)
  [place]   [id number] 03/19/22 Arrival Time: 6314  ASSESSMENT & PLAN:  1. Viral URI with cough   2. Sore throat    No distress. Tolerating PO intake. Discussed typical duration of viral illnesses. OTC symptom care as needed.  New Prescriptions   HYDROCODONE BIT-HOMATROPINE (HYCODAN) 5-1.5 MG/5ML SYRUP    Take 5 mLs by mouth every 6 (six) hours as needed for cough.     Follow-up Information     Biagio Borg, MD.   Specialties: Internal Medicine, Radiology Why: As needed. Contact information: [place] [address] [phone]                 Reviewed expectations re: course of current medical issues. Questions answered. Outlined signs and symptoms indicating need for more acute intervention. Understanding verbalized. After Visit Summary given.   SUBJECTIVE: History from: Patient. Jessica Macdonald is a 66 y.o. female. Reports: nasal cong; cough; ST; abrupt onset x 3 days. Fever of 101F at onset; none sine. Normal PO intake without n/v/d. Throat and cough bothering her the most.  OBJECTIVE:  Vitals:   03/19/22 0815  BP: 110/70  Pulse: 99  Resp: 18  Temp: 97.7 F (36.5 C)  SpO2: 95%    General appearance: alert; no distress Eyes: PERRLA; EOMI; conjunctiva normal HENT: [place]; AT; with nasal congestion; throat with significant cobblestoning Neck: supple  Lungs: speaks full sentences without difficulty; unlabored Extremities: no edema Skin: warm and dry Neurologic: normal gait Psychological: alert and cooperative; normal mood and affect   No Known Allergies  Past Medical History:  Diagnosis Date   Allergy    seasonal   Cancer ([place])    melanoma   Elevated coronary artery calcium score 05/05/2021   Family history of colon cancer 05/05/2021   HLD (hyperlipidemia) 05/05/2021   Melanoma ([place]) 05/05/2021   Social History   Socioeconomic History   Marital status: Married    Spouse name: Not on file   Number of  children: Not on file   Years of education: Not on file   Highest education level: Not on file  Occupational History   Not on file  Tobacco Use   Smoking status: Never    Passive exposure: Past   Smokeless tobacco: Never  Vaping Use   Vaping Use: Never used  Substance and Sexual Activity   Alcohol use: Yes    Alcohol/week: 1.0 standard drink of alcohol    Types: 1 Glasses of wine per week    Comment: occasional   Drug use: Never   Sexual activity: Not on file  Other Topics Concern   Not on file  Social History Narrative   Not on file   Social Determinants of Health   Financial Resource Strain: Not on file  Food Insecurity: Not on file  Transportation Needs: Not on file  Physical Activity: Not on file  Stress: Not on file  Social Connections: Not on file  Intimate Partner Violence: Not on file   Family History  Problem Relation Age of Onset   Colon cancer Mother    Colon cancer Paternal Grandmother    Heart disease Father    Past Surgical History:  Procedure Laterality Date   carpel tunnel     CHOLECYSTECTOMY     2017 February   MELANOMA EXCISION  2009   right upper arm     Vanessa Kick, MD 03/19/22 [phone]

## 2022-03-19 NOTE — Discharge Instructions (Signed)
You may use over the counter ibuprofen or acetaminophen as needed.  For a sore throat, over the counter products such as [employer] Peroxyl Mouth Sore Rinse or Chloraseptic Sore Throat Spray may provide some temporary relief. Be aware, your cough medication may cause drowsiness. Please do not drive, operate heavy machinery or make important decisions while on this medication, it can cloud your judgement.

## 2022-03-30 DIAGNOSIS — Z1151 Encounter for screening for human papillomavirus (HPV): Secondary | ICD-10-CM | POA: Diagnosis not present

## 2022-03-30 DIAGNOSIS — Z6835 Body mass index (BMI) 35.0-35.9, adult: Secondary | ICD-10-CM | POA: Diagnosis not present

## 2022-03-30 DIAGNOSIS — Z124 Encounter for screening for malignant neoplasm of cervix: Secondary | ICD-10-CM | POA: Diagnosis not present

## 2022-03-30 DIAGNOSIS — Z1231 Encounter for screening mammogram for malignant neoplasm of breast: Secondary | ICD-10-CM | POA: Diagnosis not present

## 2022-04-18 ENCOUNTER — Telehealth: Payer: Self-pay

## 2022-04-18 NOTE — Telephone Encounter (Signed)
LM for patient to return my call to schedule AWV-I at any time with either nurse AWV or nurse health advisor.

## 2022-05-06 ENCOUNTER — Other Ambulatory Visit (INDEPENDENT_AMBULATORY_CARE_PROVIDER_SITE_OTHER): Payer: Medicare HMO

## 2022-05-06 DIAGNOSIS — E538 Deficiency of other specified B group vitamins: Secondary | ICD-10-CM | POA: Diagnosis not present

## 2022-05-06 DIAGNOSIS — E7800 Pure hypercholesterolemia, unspecified: Secondary | ICD-10-CM

## 2022-05-06 DIAGNOSIS — E559 Vitamin D deficiency, unspecified: Secondary | ICD-10-CM

## 2022-05-06 DIAGNOSIS — R739 Hyperglycemia, unspecified: Secondary | ICD-10-CM | POA: Diagnosis not present

## 2022-05-06 LAB — URINALYSIS, ROUTINE W REFLEX MICROSCOPIC
Bilirubin Urine: NEGATIVE
Hgb urine dipstick: NEGATIVE
Ketones, ur: NEGATIVE
Nitrite: NEGATIVE
RBC / HPF: NONE SEEN (ref 0–?)
Specific Gravity, Urine: 1.02 (ref 1.000–1.030)
Total Protein, Urine: NEGATIVE
Urine Glucose: NEGATIVE
Urobilinogen, UA: 0.2 (ref 0.0–1.0)
pH: 6 (ref 5.0–8.0)

## 2022-05-06 LAB — BASIC METABOLIC PANEL
BUN: 18 mg/dL (ref 6–23)
CO2: 24 mEq/L (ref 19–32)
Calcium: 9.5 mg/dL (ref 8.4–10.5)
Chloride: 103 mEq/L (ref 96–112)
Creatinine, Ser: 0.83 mg/dL (ref 0.40–1.20)
GFR: 73.93 mL/min (ref >60.00)
Glucose, Bld: 98 mg/dL (ref 70–99)
Potassium: 4.1 mEq/L (ref 3.5–5.1)
Sodium: 137 mEq/L (ref 135–145)

## 2022-05-06 LAB — CBC WITH DIFFERENTIAL/PLATELET
Basophils Absolute: 0.1 10*3/uL (ref 0.0–0.1)
Basophils Relative: 0.7 % (ref 0.0–3.0)
Eosinophils Absolute: 0.3 10*3/uL (ref 0.0–0.7)
Eosinophils Relative: 4.2 % (ref 0.0–5.0)
HCT: 38.9 % (ref 36.0–46.0)
Hemoglobin: 13 g/dL (ref 12.0–15.0)
Lymphocytes Relative: 30.5 % (ref 12.0–46.0)
Lymphs Abs: 2.5 10*3/uL (ref 0.7–4.0)
MCHC: 33.3 g/dL (ref 30.0–36.0)
MCV: 89.1 fl (ref 78.0–100.0)
Monocytes Absolute: 0.4 10*3/uL (ref 0.1–1.0)
Monocytes Relative: 4.5 % (ref 3.0–12.0)
Neutro Abs: 4.9 10*3/uL (ref 1.4–7.7)
Neutrophils Relative %: 60.1 % (ref 43.0–77.0)
Platelets: 259 10*3/uL (ref 150.0–400.0)
RBC: 4.36 Mil/uL (ref 3.87–5.11)
RDW: 14.1 % (ref 11.5–15.5)
WBC: 8.2 10*3/uL (ref 4.0–10.5)

## 2022-05-06 LAB — LIPID PANEL
Cholesterol: 254 mg/dL — ABNORMAL HIGH (ref 0–200)
HDL: 53.7 mg/dL (ref >39.00)
NonHDL: 200.77
Total CHOL/HDL Ratio: 5
Triglycerides: 212 mg/dL — ABNORMAL HIGH (ref 0.0–149.0)
VLDL: 42.4 mg/dL — ABNORMAL HIGH (ref 0.0–40.0)

## 2022-05-06 LAB — HEPATIC FUNCTION PANEL
ALT: 22 U/L (ref 0–35)
AST: 26 U/L (ref 0–37)
Albumin: 4.2 g/dL (ref 3.5–5.2)
Alkaline Phosphatase: 128 U/L — ABNORMAL HIGH (ref 39–117)
Bilirubin, Direct: 0.1 mg/dL (ref 0.0–0.3)
Total Bilirubin: 0.4 mg/dL (ref 0.2–1.2)
Total Protein: 7.5 g/dL (ref 6.0–8.3)

## 2022-05-06 LAB — VITAMIN B12: Vitamin B-12: 544 pg/mL (ref 211–911)

## 2022-05-06 LAB — HEMOGLOBIN A1C: Hgb A1c MFr Bld: 6 % (ref 4.6–6.5)

## 2022-05-06 LAB — VITAMIN D 25 HYDROXY (VIT D DEFICIENCY, FRACTURES): VITD: 26.36 ng/mL — ABNORMAL LOW (ref 30.00–100.00)

## 2022-05-06 LAB — LDL CHOLESTEROL, DIRECT: Direct LDL: 211 mg/dL

## 2022-05-06 LAB — TSH: TSH: 3.89 u[IU]/mL (ref 0.35–5.50)

## 2022-05-11 ENCOUNTER — Encounter: Payer: Self-pay | Admitting: Internal Medicine

## 2022-05-11 ENCOUNTER — Ambulatory Visit (INDEPENDENT_AMBULATORY_CARE_PROVIDER_SITE_OTHER): Payer: Medicare HMO | Admitting: Internal Medicine

## 2022-05-11 VITALS — BP 120/68 | HR 98 | Temp 98.2°F | Ht 63.0 in | Wt 193.0 lb

## 2022-05-11 DIAGNOSIS — R931 Abnormal findings on diagnostic imaging of heart and coronary circulation: Secondary | ICD-10-CM | POA: Diagnosis not present

## 2022-05-11 DIAGNOSIS — Z0001 Encounter for general adult medical examination with abnormal findings: Secondary | ICD-10-CM

## 2022-05-11 DIAGNOSIS — R739 Hyperglycemia, unspecified: Secondary | ICD-10-CM | POA: Diagnosis not present

## 2022-05-11 DIAGNOSIS — E669 Obesity, unspecified: Secondary | ICD-10-CM

## 2022-05-11 DIAGNOSIS — E538 Deficiency of other specified B group vitamins: Secondary | ICD-10-CM | POA: Diagnosis not present

## 2022-05-11 DIAGNOSIS — E66811 Obesity, class 1: Secondary | ICD-10-CM | POA: Insufficient documentation

## 2022-05-11 DIAGNOSIS — E7800 Pure hypercholesterolemia, unspecified: Secondary | ICD-10-CM | POA: Diagnosis not present

## 2022-05-11 DIAGNOSIS — E559 Vitamin D deficiency, unspecified: Secondary | ICD-10-CM

## 2022-05-11 DIAGNOSIS — Z23 Encounter for immunization: Secondary | ICD-10-CM

## 2022-05-11 NOTE — Assessment & Plan Note (Signed)
Cardaic CT score Essentially zero in June 2022 - no need for asa or statin at this time, cont low chol diet

## 2022-05-11 NOTE — Assessment & Plan Note (Signed)
Last vitamin D Lab Results  Component Value Date   VD25OH 26.36 (L) 05/06/2022   Remains low on 3000 u qd, so to increase oral replacement to 5000 u qd

## 2022-05-11 NOTE — Progress Notes (Signed)
Patient ID: Jessica Macdonald, female   DOB: 09-Jun-1956, 66 y.o.   MRN: [id number]         Chief Complaint:: wellness exam and low vit d, hld, obesity       HPI:  Jessica Macdonald is a 66 y.o. female here for wellness exam; declines dxa, has pap and mammogram yearly with GYN; due for prevnar 20 today, and plans for flu shot and shingrix at pharmacy, o/w up to date                        Also taking 3000 u vit D daily, but Vit D still low.  Has not taken statin in past despite HLD, as Cardiac CT score in June 2022 was 0.5 only. Pt denies chest pain, increased sob or doe, wheezing, orthopnea, PND, increased LE swelling, palpitations, dizziness or syncope.   Pt denies polydipsia, polyuria, or new focal neuro s/s.   Pt denies fever, night sweats, loss of appetite, or other constitutional symptoms. Has lost some wt with better activity and retired now with more time for walking.     Wt Readings from Last 3 Encounters:  05/11/22 193 lb (87.5 kg)  07/13/21 201 lb (91.2 kg)  05/05/21 200 lb (90.7 kg)   BP Readings from Last 3 Encounters:  05/11/22 120/68  03/19/22 110/70  07/13/21 106/82   Immunization History  Administered Date(s) Administered   Moderna Sars-Covid-2 Vaccination 11/15/2019, 12/16/2019   PNEUMOCOCCAL CONJUGATE-20 05/11/2022  There are no preventive care reminders to display for this patient.    Past Medical History:  Diagnosis Date   Allergy    seasonal   Cancer ([place])    melanoma   Elevated coronary artery calcium score 05/05/2021   Family history of colon cancer 05/05/2021   HLD (hyperlipidemia) 05/05/2021   Melanoma ([place]) 05/05/2021   Past Surgical History:  Procedure Laterality Date   carpel tunnel     CHOLECYSTECTOMY     2017 February   MELANOMA EXCISION  2009   right upper arm    reports that she has never smoked. She has been exposed to tobacco smoke. She has never used smokeless tobacco. She reports current alcohol use of about 1.0 standard drink of alcohol per  week. She reports that she does not use drugs. family history includes Colon cancer in her mother and paternal grandmother; Heart disease in her father. No Known Allergies Current Outpatient Medications on File Prior to Visit  Medication Sig Dispense Refill   b complex vitamins tablet Take 1 tablet by mouth daily.     fish oil-omega-3 fatty acids 1000 MG capsule Take 1 g by mouth daily.     HYDROcodone bit-homatropine (HYCODAN) 5-1.5 MG/5ML syrup Take 5 mLs by mouth every 6 (six) hours as needed for cough. 90 mL 0   ibuprofen (ADVIL) 200 MG tablet Take 200 mg by mouth every 6 (six) hours as needed.     Multiple Vitamins-Minerals (ONE-A-DAY VITACRAVES ADULT) CHEW Chew by mouth.     Probiotic Product (PROBIOTIC & ACIDOPHILUS EX ST PO) Take 1 tablet by mouth daily.     No current facility-administered medications on file prior to visit.        ROS:  All others reviewed and negative.  Objective        PE:  BP 120/68 (BP Location: Right Arm, Patient Position: Sitting, Cuff Size: Large)   Pulse 98   Temp 98.2 F (36.8 C) (Oral)  Ht '5\' 3"'$  (1.6 m)   Wt 193 lb (87.5 kg)   SpO2 94%   BMI 34.19 kg/m                 Constitutional: Pt appears in NAD               HENT: Head: NCAT.                Right Ear: External ear normal.                 Left Ear: External ear normal.                Eyes: . Pupils are equal, round, and reactive to light. Conjunctivae and EOM are normal               Nose: without d/c or deformity               Neck: Neck supple. Gross normal ROM               Cardiovascular: Normal rate and regular rhythm.                 Pulmonary/Chest: Effort normal and breath sounds without rales or wheezing.                Abd:  Soft, NT, ND, + BS, no organomegaly               Neurological: Pt is alert. At baseline orientation, motor grossly intact               Skin: Skin is warm. No rashes, no other new lesions, LE edema - none               Psychiatric: Pt behavior is  normal without agitation   Micro: none  Cardiac tracings I have personally interpreted today:  none  Pertinent Radiological findings (summarize): none   Lab Results  Component Value Date   WBC 8.2 05/06/2022   HGB 13.0 05/06/2022   HCT 38.9 05/06/2022   PLT 259.0 05/06/2022   GLUCOSE 98 05/06/2022   CHOL 254 (H) 05/06/2022   TRIG 212.0 (H) 05/06/2022   HDL 53.70 05/06/2022   LDLDIRECT 211.0 05/06/2022   ALT 22 05/06/2022   AST 26 05/06/2022   NA 137 05/06/2022   K 4.1 05/06/2022   CL 103 05/06/2022   CREATININE 0.83 05/06/2022   BUN 18 05/06/2022   CO2 24 05/06/2022   TSH 3.89 05/06/2022   HGBA1C 6.0 05/06/2022   Assessment/Plan:  Jessica Macdonald is a 66 y.o. White or Caucasian [1] female with  has a past medical history of Allergy, Cancer ([place]), Elevated coronary artery calcium score (05/05/2021), Family history of colon cancer (05/05/2021), HLD (hyperlipidemia) (05/05/2021), and Melanoma ([place]) (05/05/2021).  Elevated coronary artery calcium score Essentially zero in June 2022 - no need for asa or statin at this time, cont low chol diet  HLD (hyperlipidemia) Cardaic CT score Essentially zero in June 2022 - no need for asa or statin at this time, cont low chol diet  Obesity (BMI 30.0-34.9) Improving with better diet and exercise, pt to continue efforts  Vitamin D deficiency Last vitamin D Lab Results  Component Value Date   VD25OH 26.36 (L) 05/06/2022   Remains low on 3000 u qd, so to increase oral replacement to 5000 u qd   Encounter for well adult exam with abnormal findings Age and sex appropriate education and  counseling updated with regular exercise and diet Referrals for preventative services - none needed Immunizations addressed - for Prevnar 20 today, to have flu and shingrix at pharmacy  Smoking counseling  - none needed Evidence for depression or other mood disorder - none significant Most recent labs reviewed. I have personally reviewed and have  noted: 1) the patient's medical and social history 2) The patient's current medications and supplements 3) The patient's height, weight, and BMI have been recorded in the chart  Followup: Return in about 1 year (around 05/12/2023).  Cathlean Cower, MD 05/11/2022 8:49 AM [facility]

## 2022-05-11 NOTE — Assessment & Plan Note (Signed)
Essentially zero in June 2022 - no need for asa or statin at this time, cont low chol diet

## 2022-05-11 NOTE — Assessment & Plan Note (Signed)
Improving with better diet and exercise, pt to continue efforts

## 2022-05-11 NOTE — Patient Instructions (Signed)
You had the Prevnar 20 shot today  Please plan to have your Flu shot and Shingrix at the pharmacy)  [place] to increase the Vit D to 5000 units per day  Please continue all other medications as before, and refills have been done if requested.  Please have the pharmacy call with any other refills you may need.  Please continue your efforts at being more active, low cholesterol diet, and weight control.  You are otherwise up to date with prevention measures today.  Please keep your appointments with your specialists as you may have planned  Please make an Appointment to return for your 1 year visit, or sooner if needed, with Lab testing by Appointment as well, to be done about 3-5 days before at the [facility] (so this is for TWO appointments - please see the scheduling desk as you leave)

## 2022-05-11 NOTE — Assessment & Plan Note (Signed)
Age and sex appropriate education and counseling updated with regular exercise and diet Referrals for preventative services - none needed Immunizations addressed - for Prevnar 20 today, to have flu and shingrix at pharmacy  Smoking counseling  - none needed Evidence for depression or other mood disorder - none significant Most recent labs reviewed. I have personally reviewed and have noted: 1) the patient's medical and social history 2) The patient's current medications and supplements 3) The patient's height, weight, and BMI have been recorded in the chart

## 2022-08-31 ENCOUNTER — Inpatient Hospital Stay: Payer: Medicare HMO | Attending: Genetic Counselor | Admitting: Genetic Counselor

## 2022-08-31 ENCOUNTER — Encounter: Payer: Self-pay | Admitting: Genetic Counselor

## 2022-08-31 ENCOUNTER — Other Ambulatory Visit: Payer: Self-pay | Admitting: Genetic Counselor

## 2022-08-31 ENCOUNTER — Inpatient Hospital Stay: Payer: Medicare HMO

## 2022-08-31 ENCOUNTER — Other Ambulatory Visit: Payer: Self-pay

## 2022-08-31 DIAGNOSIS — Z8042 Family history of malignant neoplasm of prostate: Secondary | ICD-10-CM

## 2022-08-31 DIAGNOSIS — Z808 Family history of malignant neoplasm of other organs or systems: Secondary | ICD-10-CM | POA: Insufficient documentation

## 2022-08-31 DIAGNOSIS — C439 Malignant melanoma of skin, unspecified: Secondary | ICD-10-CM

## 2022-08-31 LAB — GENETIC SCREENING ORDER

## 2022-08-31 NOTE — Progress Notes (Signed)
REFERRING PROVIDER: Biagio Borg, MD [address] [place],  [place] [address]  PRIMARY PROVIDER:  Biagio Borg, MD  PRIMARY REASON FOR VISIT:  1. Family history of melanoma   2. Family history of prostate cancer   3. Malignant melanoma, unspecified site [facility])      HISTORY OF PRESENT ILLNESS:   Jessica Macdonald, a 67 y.o. female, was seen for a [place] cancer genetics consultation at the request of Dr. Jenny Reichmann due to a personal and family history of cancer.  [name] presents to clinic today to discuss the possibility of a hereditary predisposition to cancer, genetic testing, and to further clarify her future cancer risks, as well as potential cancer risks for family members.   In 2018, at the age of 45, Ms. January was diagnosed in two areas with melanoma. The treatment plan included Mose surgery.    CANCER HISTORY:  Oncology History   No history exists.     RISK FACTORS:  Menarche was at age 9-16.  First live birth at age 61.  OCP use for approximately  20  years.  Ovaries intact: yes.  Hysterectomy: no.  Menopausal status: postmenopausal.  HRT use: 0 years. Colonoscopy: yes;  3 polyps . Mammogram within the last year: yes. Number of breast biopsies: 0. Up to date with pelvic exams: yes. Any excessive radiation exposure in the past: no  Past Medical History:  Diagnosis Date   Allergy    seasonal   Cancer ([place])    melanoma   Elevated coronary artery calcium score 05/05/2021   Family history of colon cancer 05/05/2021   Family history of colon cancer    Family history of melanoma    Family history of prostate cancer    HLD (hyperlipidemia) 05/05/2021   Melanoma ([place]) 05/05/2021    Past Surgical History:  Procedure Laterality Date   carpel tunnel     CHOLECYSTECTOMY     2017 February   MELANOMA EXCISION  2009   right upper arm    Social History   Socioeconomic History   Marital status: Married    Spouse name: Not on file   Number of children: Not on  file   Years of education: Not on file   Highest education level: Not on file  Occupational History   Not on file  Tobacco Use   Smoking status: Never    Passive exposure: Past   Smokeless tobacco: Never  Vaping Use   Vaping Use: Never used  Substance and Sexual Activity   Alcohol use: Yes    Alcohol/week: 1.0 standard drink of alcohol    Types: 1 Glasses of wine per week    Comment: occasional   Drug use: Never   Sexual activity: Not on file  Other Topics Concern   Not on file  Social History Narrative   Not on file   Social Determinants of Health   Financial Resource Strain: Not on file  Food Insecurity: Not on file  Transportation Needs: Not on file  Physical Activity: Not on file  Stress: Not on file  Social Connections: Not on file     FAMILY HISTORY:  We obtained a detailed, 4-generation family history.  Significant diagnoses are listed below: Family History  Problem Relation Age of Onset   Colon cancer Mother    Heart disease Father    Prostate cancer Father    Melanoma Sister    Prostate cancer Brother 66   BRCA 1/2 Brother  BRCA2+   Head & neck cancer Brother 78   Colon cancer Paternal Grandmother    Throat cancer Paternal Grandfather       The patient has one daughter who is cancer free.  She has three sisters and two brothers.  One brother was diagnosed with a mouth cancer and died at 35.  A second brother was recently diagnosed with prostate cancer and identified as having a BRCA2 mutation.  One sister was found to have two spots with melanoma.  Both parents are deceased.  The patient's mother was diagnosed with colon cancer at 32.  She had a brother and sister who were cancer free. Her parents are deceased.  The patient's father had prostate cancer.  He had a sister who did not have cancer.  His father had some form of cancer and his mother had colon cancer.  Ms. Tercero is aware of previous family history of genetic testing for hereditary  cancer risks. Patient's maternal ancestors are of [place] descent, and paternal ancestors are of English descent. There is no reported Ashkenazi Jewish ancestry. There is no known consanguinity.  GENETIC COUNSELING ASSESSMENT: [name] is a 67 y.o. female with a personal and family history of cancer which is somewhat suggestive of a hereditary cancer syndrome and predisposition to cancer given the known BRCA2 mutation but also having more than 3 cases of melanoma in the family increases the risk for a melanoma syndromes. We, therefore, discussed and recommended the following at today's visit.   DISCUSSION: We discussed that, in general, most cancer is not inherited in families, but instead is sporadic or familial. Sporadic cancers occur by chance and typically happen at older ages (>50 years) as this type of cancer is caused by genetic changes acquired during an individual's lifetime. Some families have more cancers than would be expected by chance; however, the ages or types of cancer are not consistent with a known genetic mutation or known genetic mutations have been ruled out. This type of familial cancer is thought to be due to a combination of multiple genetic, environmental, hormonal, and lifestyle factors. While this combination of factors likely increases the risk of cancer, the exact source of this risk is not currently identifiable or testable.  We discussed that 5 - 10% of melanoma is hereditary, with most cases associated with CDKN2A.  There are other genes that can be associated with hereditary melanoma cancer syndromes.  These include POT1, BAP1 and MITF.  Additionally, she is at 50% risk for testing positive for the BRCA2 mutation found in her brother.  We discussed that testing is beneficial for several reasons including knowing how to follow individuals and understanding if other family members could be at risk for cancer and allow them to undergo genetic testing.   We reviewed the  characteristics, features and inheritance patterns of hereditary cancer syndromes. We also discussed genetic testing, including the appropriate family members to test, the process of testing, insurance coverage and turn-around-time for results. We discussed the implications of a negative, positive, carrier and/or variant of uncertain significant result. Ms. Criscione  was offered a common hereditary cancer panel (47 genes) and an expanded pan-cancer panel (77 genes). Ms. Paulding was informed of the benefits and limitations of each panel, including that expanded pan-cancer panels contain genes that do not have clear management guidelines at this point in time.  We also discussed that as the number of genes included on a panel increases, the chances of variants of uncertain significance increases.  Ms. Cataldi decided to pursue genetic testing for the CancerNext-Expanded+RNAinsight gene panel.   The CancerNext-Expanded gene panel offered by [facility] and includes sequencing and rearrangement analysis for the following 77 genes: AIP, ALK, APC*, ATM*, AXIN2, BAP1, BARD1, BLM, BMPR1A, BRCA1*, BRCA2*, BRIP1*, CDC73, CDH1*, CDK4, CDKN1B, CDKN2A, CHEK2*, CTNNA1, DICER1, FANCC, FH, FLCN, GALNT12, KIF1B, LZTR1, MAX, MEN1, MET, MLH1*, MSH2*, MSH3, MSH6*, MUTYH*, NBN, NF1*, NF2, NTHL1, PALB2*, PHOX2B, PMS2*, POT1, PRKAR1A, PTCH1, PTEN*, RAD51C*, RAD51D*, RB1, RECQL, RET, SDHA, SDHAF2, SDHB, SDHC, SDHD, SMAD4, SMARCA4, SMARCB1, SMARCE1, STK11, SUFU, TMEM127, TP53*, TSC1, TSC2, VHL and XRCC2 (sequencing and deletion/duplication); EGFR, EGLN1, HOXB13, KIT, MITF, PDGFRA, POLD1, and POLE (sequencing only); EPCAM and GREM1 (deletion/duplication only). DNA and RNA analyses performed for * genes.   Based on Ms. Meschke's personal and family history of cancer, she meets medical criteria for genetic testing. Despite that she meets criteria, she may still have an out of pocket cost. We discussed that if her out of pocket cost for testing is  over $100, the laboratory will call and confirm whether she wants to proceed with testing.  If the out of pocket cost of testing is less than $100 she will be billed by the genetic testing laboratory.   We discussed that some people do not want to undergo genetic testing due to fear of genetic discrimination.  A federal law called the Genetic Information Non-Discrimination Act (GINA) of 2008 helps protect individuals against genetic discrimination based on their genetic test results.  It impacts both health insurance and employment.  With health insurance, it protects against increased premiums, being kicked off insurance or being forced to take a test in order to be insured.  For employment it protects against hiring, firing and promoting decisions based on genetic test results.  GINA does not apply to those in the [employer], those who work for companies with less than 15 employees, and new life insurance or long-term disability insurance policies.  Health status due to a cancer diagnosis is not protected under GINA.     PLAN: After considering the risks, benefits, and limitations, Ms. Macmaster provided informed consent to pursue genetic testing and the blood sample was sent to [facility] for analysis of the CancerNext-Expanded+RNAinsight. Results should be available within approximately 2-3 weeks' time, at which point they will be disclosed by telephone to Ms. Romey, as will any additional recommendations warranted by these results. Ms. Maybee will receive a summary of her genetic counseling visit and a copy of her results once available. This information will also be available in Epic.   Lastly, we encouraged Ms. Kats to remain in contact with cancer genetics annually so that we can continuously update the family history and inform her of any changes in cancer genetics and testing that may be of benefit for this family.   Ms. Laitinen questions were answered to her satisfaction today. Our  contact information was provided should additional questions or concerns arise. Thank you for the referral and allowing [place] to share in the care of your patient.   Kenyia Wambolt P. Florene Glen, [place], [facility] Licensed, Insurance risk surveyor [email or web address] phone: [phone]  The patient was seen for a total of 25 minutes in face-to-face genetic counseling.  The patient was seen alone.  Drs. Michell Heinrich, and/or [place] were available for questions, if needed..    _______________________________________________________________________ For Office Staff:  Number of people involved in session: 1 Was an Intern/ student involved with case: no

## 2022-09-20 ENCOUNTER — Encounter: Payer: Self-pay | Admitting: Genetic Counselor

## 2022-09-20 ENCOUNTER — Telehealth: Payer: Self-pay | Admitting: Genetic Counselor

## 2022-09-20 DIAGNOSIS — Z7689 Persons encountering health services in other specified circumstances: Secondary | ICD-10-CM | POA: Insufficient documentation

## 2022-09-20 DIAGNOSIS — Z1379 Encounter for other screening for genetic and chromosomal anomalies: Secondary | ICD-10-CM | POA: Insufficient documentation

## 2022-09-20 NOTE — Telephone Encounter (Signed)
Revealed that the known BRCA2 mutation identified in the family was NOT found on her testing.  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  Family members will need to be notified.  This is a recessive condition and is not a cause of her melanoma.

## 2022-09-22 ENCOUNTER — Encounter: Payer: Self-pay | Admitting: Genetic Counselor

## 2022-09-22 ENCOUNTER — Ambulatory Visit: Payer: Self-pay | Admitting: Genetic Counselor

## 2022-09-22 DIAGNOSIS — Z1379 Encounter for other screening for genetic and chromosomal anomalies: Secondary | ICD-10-CM

## 2022-09-22 NOTE — Progress Notes (Signed)
HPI: Jessica Macdonald was previously seen in the [facility] clinic due to a family of cancer and concerns regarding a hereditary predisposition to cancer. Please refer to our prior cancer genetics clinic note for more information regarding Ms. Mabe's medical, social and family histories, and our assessment and recommendations, at the time. Ms. Lafalce recent genetic test results were disclosed to her, as were recommendations warranted by these results. These results and recommendations are discussed in more detail below.   FAMILY HISTORY:  We obtained a detailed, 4-generation family history.  Significant diagnoses are listed below: Family History  Problem Relation Age of Onset   Colon cancer Mother    Heart disease Father    Prostate cancer Father    Melanoma Sister    Prostate cancer Brother 5   BRCA 1/2 Brother        BRCA2+   Head & neck cancer Brother 14   Colon cancer Paternal Grandmother    Throat cancer Paternal Grandfather          The patient has one daughter who is cancer free.  She has three sisters and two brothers.  One brother was diagnosed with a mouth cancer and died at 71.  A second brother was recently diagnosed with prostate cancer and identified as having a BRCA2 mutation.  One sister was found to have two spots with melanoma.  Both parents are deceased.   The patient's mother was diagnosed with colon cancer at 73.  She had a brother and sister who were cancer free. Her parents are deceased.   The patient's father had prostate cancer.  He had a sister who did not have cancer.  His father had some form of cancer and his mother had colon cancer.   Ms. Alcalde is aware of previous family history of genetic testing for hereditary cancer risks. Patient's maternal ancestors are of [place] descent, and paternal ancestors are of English descent. There is no reported Ashkenazi Jewish ancestry. There is no known consanguinity  GENETIC TEST RESULTS: At the time of Ms.  Sherrard's visit, we recommended she pursue genetic testing of the CancerNext-Expanded+RNAinsight. This test, which included sequencing and deletion/duplication analysis of the genes listed on the test report, was performed at [facility]. Ms. Zafar was called today with her genetic test results. 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  Since Ms. 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 A copy of the test report has been scanned into Epic and is located under the Molecular Pathology section of the Results Review tab.   DISCUSSION: Pathogenic variants in the [facility] gene are known to cause Fanconi anemia type C (FA-C), which is inherited in an autosomal recessive fashion.  FA-C contributes to approximately 14% of total Fanconi anemia diagnoses and is characterized by clinical findings such as progressive bone marrow failure, adult onset aplastic anemia, pre- and postnatal growth deficiency and abnormal skin pigmentation, characteristic skeletal malformations and impaired endocrine functioning.    Monoallelic alterations in this gene have been detected in individuals with breast cancer, however, current data show no established association of FANCC alterations with increased risk of breast cancer over that of the general population.  Individuals of reproductive age are at 25% risk of having a child with FA-C with each pregnancy when BOTH biological parents have a pathogenic alteration in [facility].  FAMILY MEMBERS: Since we now know the  mutation in Ms. Geurin, we can test at-risk relatives to determine whether or not they have inherited the mutation and are at increased risk for cancer. We will be happy to meet with any of the family members or refer them to a genetic counselor in their local area. To locate genetic counselors in other cities, individuals can visit the  website of the [employer] (ArtistMovie.[email or web address]) and Secretary/administrator for a Social worker by zip code.   We strongly encouraged Ms. Gumz to remain in contact with [place] in cancer genetics on an annual basis so we can update Ms. Gulley's personal and family histories, and inform her of advances in cancer genetics that may be of benefit for the entire family. Ms. Silbernagel knows she is also welcome to call with any questions or concerns, at any time.   Roma Kayser, [place], [facility]  Licensed, Certified Genetic Counselor [email or web address] phone: [phone]

## 2022-10-24 ENCOUNTER — Ambulatory Visit (INDEPENDENT_AMBULATORY_CARE_PROVIDER_SITE_OTHER): Payer: Medicare HMO

## 2022-10-24 VITALS — Ht 63.0 in | Wt 190.0 lb

## 2022-10-24 DIAGNOSIS — Z0000 Encounter for general adult medical examination without abnormal findings: Secondary | ICD-10-CM

## 2022-10-24 NOTE — Patient Instructions (Signed)
Jessica Macdonald , Thank you for taking time to come for your Medicare Wellness Visit. I appreciate your ongoing commitment to your health goals. Please review the following plan we discussed and let me know if I can assist you in the future.   These are the goals we discussed:  Goals      Continue to maintain your health, stay independent and physically active.  It makes a huge difference in your life.        This is a list of the screening recommended for you and due dates:  Health Maintenance  Topic Date Due   DTaP/Tdap/Td vaccine (1 - Tdap) Never done   Zoster (Shingles) Vaccine (1 of 2) Never done   COVID-19 Vaccine (3 - Moderna risk series) 01/13/2020   DEXA scan (bone density measurement)  05/12/2023*   Hepatitis C Screening: USPSTF Recommendation to screen - Ages 18-79 yo.  05/12/2023*   HIV Screening  05/12/2023*   Mammogram  03/02/2023   Medicare Annual Wellness Visit  10/24/2023   Pap Smear  03/01/2024   Colon Cancer Screening  05/26/2024   Pneumonia Vaccine  Completed   Flu Shot  Completed   HPV Vaccine  Aged Out  *Topic was postponed. The date shown is not the original due date.    Advanced directives: Yes  Conditions/risks identified: Yes  Next appointment: Follow up in one year for your annual wellness visit.   Preventive Care 38 Years and Older, Female Preventive care refers to lifestyle choices and visits with your health care provider that can promote health and wellness. What does preventive care include? A yearly physical exam. This is also called an annual well check. Dental exams once or twice a year. Routine eye exams. Ask your health care provider how often you should have your eyes checked. Personal lifestyle choices, including: Daily care of your teeth and gums. Regular physical activity. Eating a healthy diet. Avoiding tobacco and drug use. Limiting alcohol use. Practicing safe sex. Taking low-dose aspirin every day. Taking vitamin and mineral  supplements as recommended by your health care provider. What happens during an annual well check? The services and screenings done by your health care provider during your annual well check will depend on your age, overall health, lifestyle risk factors, and family history of disease. Counseling  Your health care provider may ask you questions about your: Alcohol use. Tobacco use. Drug use. Emotional well-being. Home and relationship well-being. Sexual activity. Eating habits. History of falls. Memory and ability to understand (cognition). Work and work Statistician. Reproductive health. Screening  You may have the following tests or measurements: Height, weight, and BMI. Blood pressure. Lipid and cholesterol levels. These may be checked every 5 years, or more frequently if you are over 37 years old. Skin check. Lung cancer screening. You may have this screening every year starting at age 77 if you have a 30-pack-year history of smoking and currently smoke or have quit within the past 15 years. Fecal occult blood test (FOBT) of the stool. You may have this test every year starting at age 13. Flexible sigmoidoscopy or colonoscopy. You may have a sigmoidoscopy every 5 years or a colonoscopy every 10 years starting at age 74. Hepatitis C blood test. Hepatitis B blood test. Sexually transmitted disease (STD) testing. Diabetes screening. This is done by checking your blood sugar (glucose) after you have not eaten for a while (fasting). You may have this done every 1-3 years. Bone density scan. This is done to  screen for osteoporosis. You may have this done starting at age 59. Mammogram. This may be done every 1-2 years. Talk to your health care provider about how often you should have regular mammograms. Talk with your health care provider about your test results, treatment options, and if necessary, the need for more tests. Vaccines  Your health care provider may recommend certain  vaccines, such as: Influenza vaccine. This is recommended every year. Tetanus, diphtheria, and acellular pertussis (Tdap, Td) vaccine. You may need a Td booster every 10 years. Zoster vaccine. You may need this after age 73. Pneumococcal 13-valent conjugate (PCV13) vaccine. One dose is recommended after age 73. Pneumococcal polysaccharide (PPSV23) vaccine. One dose is recommended after age 58. Talk to your health care provider about which screenings and vaccines you need and how often you need them. This information is not intended to replace advice given to you by your health care provider. Make sure you discuss any questions you have with your health care provider. Document Released: 09/04/2015 Document Revised: 04/27/2016 Document Reviewed: 06/09/2015 Elsevier Interactive Patient Education  2017 [place] Prevention in the Home Falls can cause injuries. They can happen to people of all ages. There are many things you can do to make your home safe and to help prevent falls. What can I do on the outside of my home? Regularly fix the edges of walkways and driveways and fix any cracks. Remove anything that might make you trip as you walk through a door, such as a raised step or threshold. Trim any bushes or trees on the path to your home. Use bright outdoor lighting. Clear any walking paths of anything that might make someone trip, such as rocks or tools. Regularly check to see if handrails are loose or broken. Make sure that both sides of any steps have handrails. Any raised decks and porches should have guardrails on the edges. Have any leaves, snow, or ice cleared regularly. Use sand or salt on walking paths during winter. Clean up any spills in your garage right away. This includes oil or grease spills. What can I do in the bathroom? Use night lights. Install grab bars by the toilet and in the tub and shower. Do not use towel bars as grab bars. Use non-skid mats or decals in  the tub or shower. If you need to sit down in the shower, use a plastic, non-slip stool. Keep the floor dry. Clean up any water that spills on the floor as soon as it happens. Remove soap buildup in the tub or shower regularly. Attach bath mats securely with double-sided non-slip rug tape. Do not have throw rugs and other things on the floor that can make you trip. What can I do in the bedroom? Use night lights. Make sure that you have a light by your bed that is easy to reach. Do not use any sheets or blankets that are too big for your bed. They should not hang down onto the floor. Have a firm chair that has side arms. You can use this for support while you get dressed. Do not have throw rugs and other things on the floor that can make you trip. What can I do in the kitchen? Clean up any spills right away. Avoid walking on wet floors. Keep items that you use a lot in easy-to-reach places. If you need to reach something above you, use a strong step stool that has a grab bar. Keep electrical cords out of the way.  Do not use floor polish or wax that makes floors slippery. If you must use wax, use non-skid floor wax. Do not have throw rugs and other things on the floor that can make you trip. What can I do with my stairs? Do not leave any items on the stairs. Make sure that there are handrails on both sides of the stairs and use them. Fix handrails that are broken or loose. Make sure that handrails are as long as the stairways. Check any carpeting to make sure that it is firmly attached to the stairs. Fix any carpet that is loose or worn. Avoid having throw rugs at the top or bottom of the stairs. If you do have throw rugs, attach them to the floor with carpet tape. Make sure that you have a light switch at the top of the stairs and the bottom of the stairs. If you do not have them, ask someone to add them for you. What else can I do to help prevent falls? Wear shoes that: Do not have high  heels. Have rubber bottoms. Are comfortable and fit you well. Are closed at the toe. Do not wear sandals. If you use a stepladder: Make sure that it is fully opened. Do not climb a closed stepladder. Make sure that both sides of the stepladder are locked into place. Ask someone to hold it for you, if possible. Clearly mark and make sure that you can see: Any grab bars or handrails. First and last steps. Where the edge of each step is. Use tools that help you move around (mobility aids) if they are needed. These include: Canes. Walkers. Scooters. Crutches. Turn on the lights when you go into a dark area. Replace any light bulbs as soon as they burn out. Set up your furniture so you have a clear path. Avoid moving your furniture around. If any of your floors are uneven, fix them. If there are any pets around you, be aware of where they are. Review your medicines with your doctor. Some medicines can make you feel dizzy. This can increase your chance of falling. Ask your doctor what other things that you can do to help prevent falls. This information is not intended to replace advice given to you by your health care provider. Make sure you discuss any questions you have with your health care provider. Document Released: 06/04/2009 Document Revised: 01/14/2016 Document Reviewed: 09/12/2014 Elsevier Interactive Patient Education  2017 [employer].

## 2022-10-24 NOTE — Progress Notes (Signed)
I connected with  Jessica Macdonald on 10/24/2022 at 3:00 p.m. EST by telephone and verified that I am speaking with the correct person using two identifiers.  Location: Patient: Home Provider: [place] Persons participating in the virtual visit: [place]   I discussed the limitations, risks, security and privacy concerns of performing an evaluation and management service by telephone and the availability of in person appointments. The patient expressed understanding and agreed to proceed.  Interactive audio and video telecommunications were attempted between this nurse and patient, however failed, due to patient having technical difficulties OR patient did not have access to video capability.  We continued and completed visit with audio only.  Some vital signs may be absent or patient reported.   Sheral Flow, LPN  Subjective:   Jessica Macdonald is a 67 y.o. female who presents for an Initial Medicare Annual Wellness Visit.  Review of Systems     Cardiac Risk Factors include: advanced age (>11mn, >>96women);dyslipidemia;obesity (BMI >30kg/m2);family history of premature cardiovascular disease     Objective:    Today's Vitals   10/24/22 1505  Weight: 190 lb (86.2 kg)  Height: '5\' 3"'$  (1.6 m)  PainSc: 0-No pain   Body mass index is 33.66 kg/m.     10/24/2022    3:07 PM 11/22/2016    1:04 PM 08/31/2015    6:31 PM  Advanced Directives  Does Patient Have a Medical Advance Directive? Yes Yes Yes  Type of [place] Living will  Does patient want to make changes to medical advance directive?   No - Patient declined  Copy of HAir Force Academyin Chart? No - copy requested No - copy requested No - copy requested    Current Medications (verified) Outpatient Encounter Medications as of 10/24/2022  Medication Sig   b complex vitamins tablet Take 1 tablet by  mouth daily.   fish oil-omega-3 fatty acids 1000 MG capsule Take 1 g by mouth daily.   ibuprofen (ADVIL) 200 MG tablet Take 200 mg by mouth every 6 (six) hours as needed.   Multiple Vitamins-Minerals (ONE-A-DAY VITACRAVES ADULT) CHEW Chew by mouth.   Probiotic Product (PROBIOTIC & ACIDOPHILUS EX ST PO) Take 1 tablet by mouth daily.   HYDROcodone bit-homatropine (HYCODAN) 5-1.5 MG/5ML syrup Take 5 mLs by mouth every 6 (six) hours as needed for cough. (Patient not taking: Reported on 10/24/2022)   No facility-administered encounter medications on file as of 10/24/2022.    Allergies (verified) Patient has no known allergies.   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   Family history of colon cancer 05/05/2021   Family history of colon cancer    Family history of melanoma    Family history of prostate cancer    HLD (hyperlipidemia) 05/05/2021   Melanoma ([place] 05/05/2021   Past Surgical History:  Procedure Laterality Date   carpel tunnel     CHOLECYSTECTOMY     2017 February   MELANOMA EXCISION  2009   right upper arm   Family History  Problem Relation Age of Onset   Colon cancer Mother    Heart disease Father    Prostate cancer Father    Melanoma Sister    Prostate cancer Brother 721  BRCA 1/2 Brother  BRCA2+   Head & neck cancer Brother 63   Colon cancer Paternal Grandmother    Throat cancer Paternal Grandfather    Social History   Socioeconomic History   Marital status: Married    Spouse name: Not on file   Number of children: Not on file   Years of education: Not on file   Highest education level: Not on file  Occupational History   Not on file  Tobacco Use   Smoking status: Never    Passive exposure: Past   Smokeless tobacco: Never  Vaping Use   Vaping Use: Never used  Substance and Sexual Activity   Alcohol use: Yes    Alcohol/week: 1.0  standard drink of alcohol    Types: 1 Glasses of wine per week    Comment: occasional   Drug use: Never   Sexual activity: Not on file  Other Topics Concern   Not on file  Social History Narrative   Not on file   Social Determinants of Health   Financial Resource Strain: Low Risk  (10/24/2022)   Overall Financial Resource Strain (CARDIA)    Difficulty of Paying Living Expenses: Not hard at all  Food Insecurity: No Food Insecurity (10/24/2022)   Hunger Vital Sign    Worried About Running Out of Food in the Last Year: Never true    Ran Out of Food in the Last Year: Never true  Transportation Needs: No Transportation Needs (10/24/2022)   PRAPARE - Hydrologist (Medical): No    Lack of Transportation (Non-Medical): No  Physical Activity: Sufficiently Active (10/24/2022)   Exercise Vital Sign    Days of Exercise per Week: 3 days    Minutes of Exercise per Session: 60 min  Stress: No Stress Concern Present (10/24/2022)   [place]    Feeling of Stress : Not at all  Social Connections: [place] (10/24/2022)   Social Connection and Isolation Panel [NHANES]    Frequency of Communication with Friends and Family: More than three times a week    Frequency of Social Gatherings with Friends and Family: More than three times a week    Attends Religious Services: More than 4 times per year    Active Member of [employer] or Organizations: Yes    Attends Music therapist: More than 4 times per year    Marital Status: Married    Tobacco Counseling Counseling given: Not Answered   Clinical Intake:  Pre-visit preparation completed: Yes  Pain : No/denies pain Pain Score: 0-No pain     BMI - recorded: 33.66 Nutritional Risks: None Diabetes: No  How often do you need to have someone help you when you read instructions, pamphlets, or other written materials from your doctor or  pharmacy?: 1 - Never What is the last grade level you completed in school?: HSG  Diabetic? No  Interpreter Needed?: No  Information entered by :: Lisette Abu, LPN.   Activities of Daily Living    10/24/2022    3:08 PM  In your present state of health, do you have any difficulty performing the following activities:  Hearing? 0  Vision? 0  Difficulty concentrating or making decisions? 0  Walking or climbing stairs? 0  Dressing or bathing? 0  Doing errands, shopping? 0  Preparing Food and eating ? N  Using the Toilet? N  In the past six months, have you accidently leaked urine? N  Do  you have problems with loss of bowel control? N  Managing your Medications? N  Managing your Finances? N  Housekeeping or managing your Housekeeping? N    Patient Care Team: Biagio Borg, MD as PCP - General (Internal Medicine) Melissa Noon, [place] as Referring Physician (Optometry)  Indicate any recent Medical Services you may have received from other than [facility] providers in the past year (date may be approximate).     Assessment:   This is a routine wellness examination for Jessica Macdonald.  Hearing/Vision screen Hearing Screening - Comments:: Denies hearing difficulties   Vision Screening - Comments:: Wears contact lenses - up to date with routine eye exams with Melissa Noon, OD.   Dietary issues and exercise activities discussed: Current Exercise Habits: Home exercise routine;Structured exercise class, Type of exercise: walking;treadmill;stretching;strength training/weights;exercise ball;calisthenics, Time (Minutes): 60, Frequency (Times/Week): 3, Weekly Exercise (Minutes/Week): 180, Intensity: Moderate, Exercise limited by: None identified   Goals Addressed             This Visit's Progress    Continue to maintain your health, stay independent and physically active.  It makes a huge difference in your life.        Depression Screen    10/24/2022    3:08 PM 05/11/2022    8:17 AM  05/11/2022    8:03 AM 05/05/2021    8:09 AM  PHQ 2/9 Scores  PHQ - 2 Score 0 1 0 0  PHQ- 9 Score   0     Fall Risk    10/24/2022    3:08 PM 05/11/2022    8:17 AM 05/11/2022    8:04 AM 05/05/2021    8:09 AM  Fall Risk   Falls in the past year? 0 0 0 0  Number falls in past yr: 0 0  0  Injury with Fall? 0 0  0  Risk for fall due to : No Fall Risks  No Fall Risks   Follow up Falls prevention discussed  Falls evaluation completed     FALL RISK PREVENTION PERTAINING TO THE HOME:  Any stairs in or around the home? No  If so, are there any without handrails? No  Home free of loose throw rugs in walkways, pet beds, electrical cords, etc? Yes  Adequate lighting in your home to reduce risk of falls? Yes   ASSISTIVE DEVICES UTILIZED TO PREVENT FALLS:  Life alert? No  Use of a cane, walker or w/c? No  Grab bars in the bathroom? Yes  Shower chair or bench in shower? Yes  Elevated toilet seat or a handicapped toilet? No   TIMED UP AND GO:  Was the test performed? No . Telephonic Visit  Cognitive Function:        10/24/2022    3:10 PM  6CIT Screen  What Year? 0 points  What month? 0 points  What time? 0 points  Count back from 20 0 points  Months in reverse 0 points  Repeat phrase 0 points  Total Score 0 points    Immunizations Immunization History  Administered Date(s) Administered   Fluad Quad(high Dose 65+) 07/27/2022   Moderna Sars-Covid-2 Vaccination 11/15/2019, 12/16/2019   PNEUMOCOCCAL CONJUGATE-20 05/11/2022    TDAP status: Due, Education has been provided regarding the importance of this vaccine. Advised may receive this vaccine at local pharmacy or Health Dept. Aware to provide a copy of the vaccination record if obtained from local pharmacy or Health Dept. Verbalized acceptance and understanding.  Flu Vaccine status: Up  to date  Pneumococcal vaccine status: Up to date  Covid-19 vaccine status: Completed vaccines  Qualifies for Shingles Vaccine? Yes    Zostavax completed No   Shingrix Completed?: No.    Education has been provided regarding the importance of this vaccine. Patient has been advised to call insurance company to determine out of pocket expense if they have not yet received this vaccine. Advised may also receive vaccine at local pharmacy or Health Dept. Verbalized acceptance and understanding.  Screening Tests Health Maintenance  Topic Date Due   DTaP/Tdap/Td (1 - Tdap) Never done   Zoster Vaccines- Shingrix (1 of 2) Never done   COVID-19 Vaccine (3 - Moderna risk series) 01/13/2020   DEXA SCAN  05/12/2023 (Originally 10/31/2021)   Hepatitis C Screening  05/12/2023 (Originally [birth year]1974)   HIV Screening  05/12/2023 (Originally [birth year]01[birth year]1971)   MAMMOGRAM  03/02/2023   Medicare Annual Wellness (AWV)  10/24/2023   PAP SMEAR-Modifier  03/01/2024   COLONOSCOPY (Pts 45-72yr Insurance coverage will need to be confirmed)  05/26/2024   Pneumonia Vaccine 66 Years old  Completed   INFLUENZA VACCINE  Completed   HPV VACCINES  Aged Out    Health Maintenance  Health Maintenance Due  Topic Date Due   DTaP/Tdap/Td (1 - Tdap) Never done   Zoster Vaccines- Shingrix (1 of 2) Never done   COVID-19 Vaccine (3 - Moderna risk series) 01/13/2020    Colorectal cancer screening: Type of screening: Colonoscopy. Completed 05/27/2019. Repeat every 5 years  Mammogram status: Completed 03/01/2021. Repeat every year - Patient goes every 2 years  Bone Density status: Never done  Lung Cancer Screening: (Low Dose CT Chest recommended if Age 879-80years, 30 pack-year currently smoking OR have quit w/in 15years.) does not qualify.   Lung Cancer Screening Referral: no  Additional Screening:  Hepatitis C Screening: does qualify; Completed no  Vision Screening: Recommended annual ophthalmology exams for early detection of glaucoma and other disorders of the eye. Is the patient up to date with their annual eye exam?  Yes  Who is the provider or what  is the name of the office in which the patient attends annual eye exams? JMelissa Noon OD. If pt is not established with a provider, would they like to be referred to a provider to establish care? No .   Dental Screening: Recommended annual dental exams for proper oral hygiene  Community Resource Referral / Chronic Care Management: CRR required this visit?  No   CCM required this visit?  No      Plan:     I have personally reviewed and noted the following in the patient's chart:   Medical and social history Use of alcohol, tobacco or illicit drugs  Current medications and supplements including opioid prescriptions. Patient is not currently taking opioid prescriptions. Functional ability and status Nutritional status Physical activity Advanced directives List of other physicians Hospitalizations, surgeries, and ER visits in previous 12 months Vitals Screenings to include cognitive, depression, and falls Referrals and appointments  In addition, I have reviewed and discussed with patient certain preventive protocols, quality metrics, and best practice recommendations. A written personalized care plan for preventive services as well as general preventive health recommendations were provided to patient.     SSheral Flow LPN   [id number]  Nurse Notes:  Normal cognitive status assessed by direct observation by this Nurse Health Advisor. No abnormalities found.

## 2023-03-10 DIAGNOSIS — L578 Other skin changes due to chronic exposure to nonionizing radiation: Secondary | ICD-10-CM | POA: Diagnosis not present

## 2023-03-10 DIAGNOSIS — D485 Neoplasm of uncertain behavior of skin: Secondary | ICD-10-CM | POA: Diagnosis not present

## 2023-03-10 DIAGNOSIS — D2271 Melanocytic nevi of right lower limb, including hip: Secondary | ICD-10-CM | POA: Diagnosis not present

## 2023-03-10 DIAGNOSIS — L821 Other seborrheic keratosis: Secondary | ICD-10-CM | POA: Diagnosis not present

## 2023-03-10 DIAGNOSIS — D2262 Melanocytic nevi of left upper limb, including shoulder: Secondary | ICD-10-CM | POA: Diagnosis not present

## 2023-03-10 DIAGNOSIS — Z87898 Personal history of other specified conditions: Secondary | ICD-10-CM | POA: Diagnosis not present

## 2023-03-10 DIAGNOSIS — D225 Melanocytic nevi of trunk: Secondary | ICD-10-CM | POA: Diagnosis not present

## 2023-03-10 DIAGNOSIS — D2362 Other benign neoplasm of skin of left upper limb, including shoulder: Secondary | ICD-10-CM | POA: Diagnosis not present

## 2023-03-10 DIAGNOSIS — D2272 Melanocytic nevi of left lower limb, including hip: Secondary | ICD-10-CM | POA: Diagnosis not present

## 2023-03-14 DIAGNOSIS — H25813 Combined forms of age-related cataract, bilateral: Secondary | ICD-10-CM | POA: Diagnosis not present

## 2023-03-14 DIAGNOSIS — H5213 Myopia, bilateral: Secondary | ICD-10-CM | POA: Diagnosis not present

## 2023-04-06 ENCOUNTER — Encounter (INDEPENDENT_AMBULATORY_CARE_PROVIDER_SITE_OTHER): Payer: Self-pay

## 2023-04-06 DIAGNOSIS — Z1231 Encounter for screening mammogram for malignant neoplasm of breast: Secondary | ICD-10-CM | POA: Diagnosis not present

## 2023-04-06 DIAGNOSIS — Z6836 Body mass index (BMI) 36.0-36.9, adult: Secondary | ICD-10-CM | POA: Diagnosis not present

## 2023-04-06 DIAGNOSIS — Z01419 Encounter for gynecological examination (general) (routine) without abnormal findings: Secondary | ICD-10-CM | POA: Diagnosis not present

## 2023-05-09 ENCOUNTER — Other Ambulatory Visit (INDEPENDENT_AMBULATORY_CARE_PROVIDER_SITE_OTHER): Payer: Medicare HMO

## 2023-05-09 DIAGNOSIS — E538 Deficiency of other specified B group vitamins: Secondary | ICD-10-CM | POA: Diagnosis not present

## 2023-05-09 DIAGNOSIS — R739 Hyperglycemia, unspecified: Secondary | ICD-10-CM

## 2023-05-09 DIAGNOSIS — E7800 Pure hypercholesterolemia, unspecified: Secondary | ICD-10-CM

## 2023-05-09 DIAGNOSIS — E559 Vitamin D deficiency, unspecified: Secondary | ICD-10-CM

## 2023-05-09 LAB — VITAMIN D 25 HYDROXY (VIT D DEFICIENCY, FRACTURES): VITD: 21.91 ng/mL — ABNORMAL LOW (ref 30.00–100.00)

## 2023-05-09 LAB — URINALYSIS, ROUTINE W REFLEX MICROSCOPIC
Bilirubin Urine: NEGATIVE
Hgb urine dipstick: NEGATIVE
Ketones, ur: NEGATIVE
Nitrite: NEGATIVE
RBC / HPF: NONE SEEN (ref 0–?)
Specific Gravity, Urine: 1.02 (ref 1.000–1.030)
Total Protein, Urine: NEGATIVE
Urine Glucose: NEGATIVE
Urobilinogen, UA: 0.2 (ref 0.0–1.0)
pH: 6 (ref 5.0–8.0)

## 2023-05-09 LAB — LIPID PANEL
Cholesterol: 250 mg/dL — ABNORMAL HIGH (ref 0–200)
HDL: 52 mg/dL (ref >39.00)
LDL Cholesterol: 148 mg/dL — ABNORMAL HIGH (ref 0–99)
NonHDL: 198.23
Total CHOL/HDL Ratio: 5
Triglycerides: 252 mg/dL — ABNORMAL HIGH (ref 0.0–149.0)
VLDL: 50.4 mg/dL — ABNORMAL HIGH (ref 0.0–40.0)

## 2023-05-09 LAB — HEPATIC FUNCTION PANEL
ALT: 18 U/L (ref 0–35)
AST: 17 U/L (ref 0–37)
Albumin: 4.1 g/dL (ref 3.5–5.2)
Alkaline Phosphatase: 130 U/L — ABNORMAL HIGH (ref 39–117)
Bilirubin, Direct: 0.1 mg/dL (ref 0.0–0.3)
Total Bilirubin: 0.4 mg/dL (ref 0.2–1.2)
Total Protein: 7.1 g/dL (ref 6.0–8.3)

## 2023-05-09 LAB — CBC WITH DIFFERENTIAL/PLATELET
Basophils Absolute: 0.1 10*3/uL (ref 0.0–0.1)
Basophils Relative: 0.7 % (ref 0.0–3.0)
Eosinophils Absolute: 0.3 10*3/uL (ref 0.0–0.7)
Eosinophils Relative: 3.5 % (ref 0.0–5.0)
HCT: 41.2 % (ref 36.0–46.0)
Hemoglobin: 13.2 g/dL (ref 12.0–15.0)
Lymphocytes Relative: 32.7 % (ref 12.0–46.0)
Lymphs Abs: 2.7 10*3/uL (ref 0.7–4.0)
MCHC: 32.1 g/dL (ref 30.0–36.0)
MCV: 91.2 fl (ref 78.0–100.0)
Monocytes Absolute: 0.4 10*3/uL (ref 0.1–1.0)
Monocytes Relative: 4.9 % (ref 3.0–12.0)
Neutro Abs: 4.8 10*3/uL (ref 1.4–7.7)
Neutrophils Relative %: 58.2 % (ref 43.0–77.0)
Platelets: 261 10*3/uL (ref 150.0–400.0)
RBC: 4.52 Mil/uL (ref 3.87–5.11)
RDW: 13.4 % (ref 11.5–15.5)
WBC: 8.3 10*3/uL (ref 4.0–10.5)

## 2023-05-09 LAB — BASIC METABOLIC PANEL
BUN: 17 mg/dL (ref 6–23)
CO2: 27 meq/L (ref 19–32)
Calcium: 9.4 mg/dL (ref 8.4–10.5)
Chloride: 103 meq/L (ref 96–112)
Creatinine, Ser: 0.79 mg/dL (ref 0.40–1.20)
GFR: 77.89 mL/min (ref >60.00)
Glucose, Bld: 102 mg/dL — ABNORMAL HIGH (ref 70–99)
Potassium: 4.4 meq/L (ref 3.5–5.1)
Sodium: 136 meq/L (ref 135–145)

## 2023-05-09 LAB — HEMOGLOBIN A1C: Hgb A1c MFr Bld: 5.9 % (ref 4.6–6.5)

## 2023-05-09 LAB — TSH: TSH: 3.86 u[IU]/mL (ref 0.35–5.50)

## 2023-05-09 LAB — VITAMIN B12: Vitamin B-12: 515 pg/mL (ref 211–911)

## 2023-05-15 ENCOUNTER — Ambulatory Visit: Payer: Medicare HMO | Admitting: Internal Medicine

## 2023-05-15 ENCOUNTER — Encounter: Payer: Self-pay | Admitting: Internal Medicine

## 2023-05-15 VITALS — BP 120/72 | HR 75 | Temp 98.2°F | Ht 63.0 in | Wt 199.0 lb

## 2023-05-15 DIAGNOSIS — E7800 Pure hypercholesterolemia, unspecified: Secondary | ICD-10-CM

## 2023-05-15 DIAGNOSIS — Z0001 Encounter for general adult medical examination with abnormal findings: Secondary | ICD-10-CM

## 2023-05-15 DIAGNOSIS — Z23 Encounter for immunization: Secondary | ICD-10-CM

## 2023-05-15 DIAGNOSIS — E669 Obesity, unspecified: Secondary | ICD-10-CM | POA: Diagnosis not present

## 2023-05-15 DIAGNOSIS — Z0000 Encounter for general adult medical examination without abnormal findings: Secondary | ICD-10-CM | POA: Diagnosis not present

## 2023-05-15 DIAGNOSIS — T7840XD Allergy, unspecified, subsequent encounter: Secondary | ICD-10-CM

## 2023-05-15 DIAGNOSIS — R739 Hyperglycemia, unspecified: Secondary | ICD-10-CM | POA: Diagnosis not present

## 2023-05-15 DIAGNOSIS — E538 Deficiency of other specified B group vitamins: Secondary | ICD-10-CM

## 2023-05-15 DIAGNOSIS — E559 Vitamin D deficiency, unspecified: Secondary | ICD-10-CM

## 2023-05-15 DIAGNOSIS — E66811 Obesity, class 1: Secondary | ICD-10-CM

## 2023-05-15 MED ORDER — PHENTERMINE HCL 30 MG PO CAPS: 30.0000 mg | ORAL_CAPSULE | ORAL | 0 refills | Status: DC

## 2023-05-15 NOTE — Progress Notes (Signed)
Patient ID: Jessica Macdonald, female   DOB: 04-22-1956, 67 y.o.   MRN: [id number]         Chief Complaint:: wellness exam and low vit d, obesity, hld, alelrgy       HPI:  Jessica Macdonald is a 67 y.o. female here for wellness exam; will have dxa in 2025 with GYN, for shingrix and tdap at the pharmacy; for flu shot today, o/w up to date                        Also Pt denies chest pain, increased sob or doe, wheezing, orthopnea, PND, increased LE swelling, palpitations, dizziness or syncope.   Pt denies polydipsia, polyuria, or new focal neuro s/s.    Pt denies fever, wt loss, night sweats, loss of appetite, or other constitutional symptoms  Taking 2000 u vit d every day,  Unable to lose wt, does not qualify for ozempic.  Does have several wks ongoing nasal allergy symptoms with clearish congestion, itch and sneezing, without fever, pain, ST, cough, swelling or wheezing.   Wt Readings from Last 3 Encounters:  05/15/23 199 lb (90.3 kg)  10/24/22 190 lb (86.2 kg)  05/11/22 193 lb (87.5 kg)   BP Readings from Last 3 Encounters:  05/15/23 120/72  05/11/22 120/68  03/19/22 110/70   Immunization History  Administered Date(s) Administered   Fluad Quad(high Dose 65+) 07/27/2022   Fluad Trivalent(High Dose 65+) 05/15/2023   Moderna Sars-Covid-2 Vaccination 11/15/2019, 12/16/2019   PNEUMOCOCCAL CONJUGATE-20 05/11/2022   Health Maintenance Due  Topic Date Due   Hepatitis C Screening  Never done   DTaP/Tdap/Td (1 - Tdap) Never done   Zoster Vaccines- Shingrix (1 of 2) Never done      Past Medical History:  Diagnosis Date   Allergy    seasonal   Cancer (HCC)    melanoma   Elevated coronary artery calcium score 05/05/2021   Family history of colon cancer 05/05/2021   Family history of colon cancer    Family history of melanoma    Family history of prostate cancer    HLD (hyperlipidemia) 05/05/2021   Melanoma (HCC) 05/05/2021   Past Surgical History:  Procedure Laterality Date   carpel  tunnel     CHOLECYSTECTOMY     2017 February   MELANOMA EXCISION  2009   right upper arm    reports that she has never smoked. She has been exposed to tobacco smoke. She has never used smokeless tobacco. She reports current alcohol use of about 1.0 standard drink of alcohol per week. She reports that she does not use drugs. family history includes BRCA 1/2 in her brother; Colon cancer in her mother and paternal grandmother; Head & neck cancer (age of onset: 11) in her brother; Heart disease in her father; Melanoma in her sister; Prostate cancer in her father; Prostate cancer (age of onset: 47) in her brother; Throat cancer in her paternal grandfather. No Known Allergies Current Outpatient Medications on File Prior to Visit  Medication Sig Dispense Refill   b complex vitamins tablet Take 1 tablet by mouth daily.     fish oil-omega-3 fatty acids 1000 MG capsule Take 1 g by mouth daily.     ibuprofen (ADVIL) 200 MG tablet Take 200 mg by mouth every 6 (six) hours as needed.     Multiple Vitamins-Minerals (ONE-A-DAY VITACRAVES ADULT) CHEW Chew by mouth.     Probiotic Product (PROBIOTIC & ACIDOPHILUS EX ST  PO) Take 1 tablet by mouth daily.     HYDROcodone bit-homatropine (HYCODAN) 5-1.5 MG/5ML syrup Take 5 mLs by mouth every 6 (six) hours as needed for cough. (Patient not taking: Reported on 10/24/2022) 90 mL 0   No current facility-administered medications on file prior to visit.        ROS:  All others reviewed and negative.  Objective        PE:  BP 120/72 (BP Location: Right Arm, Patient Position: Sitting, Cuff Size: Normal)   Pulse 75   Temp 98.2 F (36.8 C) (Oral)   Ht 5\' 3"  (1.6 m)   Wt 199 lb (90.3 kg)   SpO2 99%   BMI 35.25 kg/m                 Constitutional: Pt appears in NAD               HENT: Head: NCAT.                Right Ear: External ear normal.                 Left Ear: External ear normal.                Eyes: . Pupils are equal, round, and reactive to light.  Conjunctivae and EOM are normal               Nose: without d/c or deformity               Neck: Neck supple. Gross normal ROM               Cardiovascular: Normal rate and regular rhythm.                 Pulmonary/Chest: Effort normal and breath sounds without rales or wheezing.                Abd:  Soft, NT, ND, + BS, no organomegaly               Neurological: Pt is alert. At baseline orientation, motor grossly intact               Skin: Skin is warm. No rashes, no other new lesions, LE edema - none               Psychiatric: Pt behavior is normal without agitation   Micro: none  Cardiac tracings I have personally interpreted today:  none  Pertinent Radiological findings (summarize): none   Lab Results  Component Value Date   WBC 8.3 05/09/2023   HGB 13.2 05/09/2023   HCT 41.2 05/09/2023   PLT 261.0 05/09/2023   GLUCOSE 102 (H) 05/09/2023   CHOL 250 (H) 05/09/2023   TRIG 252.0 (H) 05/09/2023   HDL 52.00 05/09/2023   LDLDIRECT 211.0 05/06/2022   LDLCALC 148 (H) 05/09/2023   ALT 18 05/09/2023   AST 17 05/09/2023   NA 136 05/09/2023   K 4.4 05/09/2023   CL 103 05/09/2023   CREATININE 0.79 05/09/2023   BUN 17 05/09/2023   CO2 27 05/09/2023   TSH 3.86 05/09/2023   HGBA1C 5.9 05/09/2023   Assessment/Plan:  Jessica Macdonald is a 67 y.o. White or Caucasian [1] female with  has a past medical history of Allergy, Cancer (HCC), Elevated coronary artery calcium score (05/05/2021), Family history of colon cancer (05/05/2021), Family history of colon cancer, Family history of melanoma, Family history of prostate cancer, HLD (  hyperlipidemia) (05/05/2021), and Melanoma (HCC) (05/05/2021).  Encounter for well adult exam with abnormal findings Age and sex appropriate education and counseling updated with regular exercise and diet Referrals for preventative services - for dxa next yr with gyn, Immunizations addressed - for shingri and tdap at pharmacy, for flu shot today Smoking  counseling  - none needed Evidence for depression or other mood disorder - none significant Most recent labs reviewed. I have personally reviewed and have noted: 1) the patient's medical and social history 2) The patient's current medications and supplements 3) The patient's height, weight, and BMI have been recorded in the chart   Allergy Mild, for start allegra otc prn  HLD (hyperlipidemia) Lab Results  Component Value Date   LDLCALC 148 (H) 05/09/2023   Uncontrolled, for lower chol diet, declines statin   Obesity (BMI 30.0-34.9) Uncontrolled, for phentermine asd,  to f/u any worsening symptoms or concerns, for increased activity  Vitamin D deficiency Last vitamin D Lab Results  Component Value Date   VD25OH 21.91 (L) 05/09/2023   Low, to increase oral replacement to 4000 u qd  Followup: Return in about 1 year (around 05/14/2024).  Oliver Barre, MD 05/18/2023 9:14 PM [facility]

## 2023-05-15 NOTE — Patient Instructions (Addendum)
Please have your Shingrix (shingles) shots done at your local pharmacy., and Tdap tetanus shot  You had the flu shot today  Please take all new medication as prescribed - the phentermine  Please continue all other medications as before, and refills have been done if requested.  Please have the pharmacy call with any other refills you may need.  Please continue your efforts at being more active, low cholesterol diet, and weight control.  You are otherwise up to date with prevention measures today.  Please keep your appointments with your specialists as you may have planned  Please make an Appointment to return for your 1 year visit, or sooner if needed, with Lab testing by Appointment as well, to be done about 3-5 days before at the FIRST FLOOR Lab (so this is for TWO appointments - please see the scheduling desk as you leave)

## 2023-05-17 DIAGNOSIS — Z124 Encounter for screening for malignant neoplasm of cervix: Secondary | ICD-10-CM | POA: Diagnosis not present

## 2023-05-18 ENCOUNTER — Encounter: Payer: Self-pay | Admitting: Internal Medicine

## 2023-05-18 NOTE — Assessment & Plan Note (Signed)
Mild, for start allegra otc prn

## 2023-05-18 NOTE — Assessment & Plan Note (Addendum)
Last vitamin D Lab Results  Component Value Date   VD25OH 21.91 (L) 05/09/2023   Low, to increase oral replacement to 4000 u qd

## 2023-05-18 NOTE — Assessment & Plan Note (Signed)
Lab Results  Component Value Date   LDLCALC 148 (H) 05/09/2023   Uncontrolled, for lower chol diet, declines statin

## 2023-05-18 NOTE — Assessment & Plan Note (Signed)
Age and sex appropriate education and counseling updated with regular exercise and diet Referrals for preventative services - for dxa next yr with gyn, Immunizations addressed - for shingri and tdap at pharmacy, for flu shot today Smoking counseling  - none needed Evidence for depression or other mood disorder - none significant Most recent labs reviewed. I have personally reviewed and have noted: 1) the patient's medical and social history 2) The patient's current medications and supplements 3) The patient's height, weight, and BMI have been recorded in the chart

## 2023-05-18 NOTE — Assessment & Plan Note (Signed)
Uncontrolled, for phentermine asd,  to f/u any worsening symptoms or concerns, for increased activity

## 2023-08-03 ENCOUNTER — Other Ambulatory Visit: Payer: Self-pay | Admitting: Internal Medicine

## 2023-08-04 MED ORDER — PHENTERMINE HCL 30 MG PO CAPS: 30.0000 mg | ORAL_CAPSULE | ORAL | 1 refills | Status: DC

## 2023-10-25 ENCOUNTER — Ambulatory Visit: Payer: Medicare HMO

## 2023-10-25 VITALS — Ht 63.0 in | Wt 175.0 lb

## 2023-10-25 DIAGNOSIS — Z0000 Encounter for general adult medical examination without abnormal findings: Secondary | ICD-10-CM

## 2023-10-25 NOTE — Patient Instructions (Signed)
 Jessica Macdonald , Thank you for taking time to come for your Medicare Wellness Visit. I appreciate your ongoing commitment to your health goals. Please review the following plan we discussed and let me know if I can assist you in the future.   Referrals/Orders/Follow-Ups/Clinician Recommendations: It was nice talking to you today.  Keep up the good work.  This is a list of the screening recommended for you and due dates:  Health Maintenance  Topic Date Due   Hepatitis C Screening  Never done   DTaP/Tdap/Td vaccine (1 - Tdap) Never done   Zoster (Shingles) Vaccine (1 of 2) Never done   COVID-19 Vaccine (3 - Moderna risk series) 01/13/2020   DEXA scan (bone density measurement)  05/14/2024*   Colon Cancer Screening  05/26/2024   Medicare Annual Wellness Visit  10/24/2024   Mammogram  04/02/2025   Pneumonia Vaccine  Completed   Flu Shot  Completed   HPV Vaccine  Aged Out  *Topic was postponed. The date shown is not the original due date.    Advanced directives: (Copy Requested) Please bring a copy of your health care power of attorney and living will to the office to be added to your chart at your convenience.  Next Medicare Annual Wellness Visit scheduled for next year: Yes

## 2023-10-25 NOTE — Progress Notes (Signed)
 Subjective:   Jessica Macdonald is a 68 y.o. who presents for a Medicare Wellness preventive visit.  Visit Complete: Virtual I connected with  Prescott Parma on 10/25/23 by a audio enabled telemedicine application and verified that I am speaking with the correct person using two identifiers.  Patient Location: Home  Provider Location: Home Office  I discussed the limitations of evaluation and management by telemedicine. The patient expressed understanding and agreed to proceed.  Vital Signs: Because this visit was a virtual/telehealth visit, some criteria may be missing or patient reported. Any vitals not documented were not able to be obtained and vitals that have been documented are patient reported.  VideoDeclined- This patient declined Librarian, academic. Therefore the visit was completed with audio only.  AWV Questionnaire: No: Patient Medicare AWV questionnaire was not completed prior to this visit.  Cardiac Risk Factors include: advanced age (>3men, >2 women);dyslipidemia;Other (see comment);obesity (BMI >30kg/m2), Risk factor comments: Elevated coronary artery calcium score     Objective:    Today's Vitals   10/25/23 0927  Weight: 175 lb (79.4 kg)  Height: 5\' 3"  (1.6 m)   Body mass index is 31 kg/m.     10/25/2023    9:42 AM 10/24/2022    3:07 PM 11/22/2016    1:04 PM 08/31/2015    6:31 PM  Advanced Directives  Does Patient Have a Medical Advance Directive? Yes Yes Yes Yes  Type of Estate agent of [place];Living will Healthcare Power of [place];Living will Healthcare Power of [place];Living will Living will  Does patient want to make changes to medical advance directive?    No - Patient declined  Copy of Healthcare Power of Attorney in Chart? No - copy requested No - copy requested No - copy requested No - copy requested    Current Medications (verified) Outpatient Encounter Medications as of 10/25/2023  Medication  Sig   b complex vitamins tablet Take 1 tablet by mouth daily.   fish oil-omega-3 fatty acids 1000 MG capsule Take 1 g by mouth daily.   ibuprofen (ADVIL) 200 MG tablet Take 200 mg by mouth every 6 (six) hours as needed.   Multiple Vitamins-Minerals (ONE-A-DAY VITACRAVES ADULT) CHEW Chew by mouth.   phentermine 30 MG capsule Take 1 capsule (30 mg total) by mouth every morning.   Probiotic Product (PROBIOTIC & ACIDOPHILUS EX ST PO) Take 1 tablet by mouth daily.   HYDROcodone bit-homatropine (HYCODAN) 5-1.5 MG/5ML syrup Take 5 mLs by mouth every 6 (six) hours as needed for cough. (Patient not taking: Reported on 10/25/2023)   No facility-administered encounter medications on file as of 10/25/2023.    Allergies (verified) Patient has no known allergies.   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   Family history of colon cancer 05/05/2021   Family history of colon cancer    Family history of melanoma    Family history of prostate cancer    HLD (hyperlipidemia) 05/05/2021   Melanoma (HCC) 05/05/2021   Past Surgical History:  Procedure Laterality Date   carpel tunnel     CHOLECYSTECTOMY     2017 February   MELANOMA EXCISION  2009   right upper arm   Family History  Problem Relation Age of Onset   Colon cancer Mother    Heart disease Father    Prostate cancer Father  Melanoma Sister    Prostate cancer Brother 76   BRCA 1/2 Brother        BRCA2+   Head & neck cancer Brother 65   Colon cancer Paternal Grandmother    Throat cancer Paternal Grandfather    Social History   Socioeconomic History   Marital status: Married    Spouse name: Jessica Macdonald   Number of children: 1   Years of education: Not on file   Highest education level: Not on file  Occupational History   Occupation: RETIRED  Tobacco Use   Smoking status: Never    Passive exposure: Past   Smokeless tobacco:  Never  Vaping Use   Vaping status: Never Used  Substance and Sexual Activity   Alcohol use: Yes    Alcohol/week: 1.0 standard drink of alcohol    Types: 1 Glasses of wine per week    Comment: occasional   Drug use: Never   Sexual activity: Not on file  Other Topics Concern   Not on file  Social History Narrative   Lives ay home with husband/2025   Social Drivers of Health   Financial Resource Strain: Low Risk  (10/25/2023)   Overall Financial Resource Strain (CARDIA)    Difficulty of Paying Living Expenses: Not hard at all  Food Insecurity: No Food Insecurity (10/25/2023)   Hunger Vital Sign    Worried About Running Out of Food in the Last Year: Never true    Ran Out of Food in the Last Year: Never true  Transportation Needs: No Transportation Needs (10/25/2023)   PRAPARE - Administrator, Civil Service (Medical): No    Lack of Transportation (Non-Medical): No  Physical Activity: Inactive (10/25/2023)   Exercise Vital Sign    Days of Exercise per Week: 0 days    Minutes of Exercise per Session: 0 min  Stress: No Stress Concern Present (10/25/2023)   [employer] of Occupational Health - Occupational Stress Questionnaire    Feeling of Stress : Not at all  Social Connections: Socially Integrated (10/25/2023)   Social Connection and Isolation Panel [NHANES]    Frequency of Communication with Friends and Family: Twice a week    Frequency of Social Gatherings with Friends and Family: Twice a week    Attends Religious Services: More than 4 times per year    Active Member of [employer] or Organizations: Yes    Attends Banker Meetings: Never    Marital Status: Married    Tobacco Counseling Counseling given: Not Answered    Clinical Intake:  Pre-visit preparation completed: Yes  Pain : No/denies pain     BMI - recorded: 31 Nutritional Status: BMI > 30  Obese Nutritional Risks: None Diabetes: No  How often do you need to have someone help you when you  read instructions, pamphlets, or other written materials from your doctor or pharmacy?: 1 - Never  Interpreter Needed?: No  Information entered by :: Jessica Macdonald, RMA   Activities of Daily Living     10/25/2023    9:28 AM  In your present state of health, do you have any difficulty performing the following activities:  Hearing? 0  Vision? 0  Difficulty concentrating or making decisions? 0  Walking or climbing stairs? 0  Dressing or bathing? 0  Doing errands, shopping? 0  Preparing Food and eating ? N  Using the Toilet? N  In the past six months, have you accidently leaked urine? N  Do you have problems  with loss of bowel control? N  Managing your Medications? N  Managing your Finances? N  Housekeeping or managing your Housekeeping? N    Patient Care Team: Corwin Levins, MD as PCP - General (Internal Medicine) Manning Charity, OD as Referring Physician (Optometry)  Indicate any recent Medical Services you may have received from other than [facility] providers in the past year (date may be approximate).     Assessment:   This is a routine wellness examination for Jessica Macdonald.  Hearing/Vision screen Hearing Screening - Comments:: Denies hearing difficulties   Vision Screening - Comments:: Wears eyeglasses   Goals Addressed               This Visit's Progress     Patient Stated (pt-stated)        Get her weight down.       Depression Screen     10/25/2023    9:47 AM 05/15/2023    8:13 AM 10/24/2022    3:08 PM 05/11/2022    8:17 AM 05/11/2022    8:03 AM 05/05/2021    8:09 AM  PHQ 2/9 Scores  PHQ - 2 Score 0 0 0 1 0 0  PHQ- 9 Score 0    0     Fall Risk     10/25/2023    9:43 AM 05/15/2023    8:13 AM 10/24/2022    3:08 PM 05/11/2022    8:17 AM 05/11/2022    8:04 AM  Fall Risk   Falls in the past year? 0 0 0 0 0  Number falls in past yr: 0 0 0 0   Injury with Fall? 0 0 0 0   Risk for fall due to : No Fall Risks No Fall Risks No Fall Risks  No Fall Risks  Follow up Falls  prevention discussed;Falls evaluation completed Falls evaluation completed Falls prevention discussed  Falls evaluation completed    MEDICARE RISK AT HOME:  Medicare Risk at Home Any stairs in or around the home?: Yes (front and back door.) If so, are there any without handrails?: Yes Home free of loose throw rugs in walkways, pet beds, electrical cords, etc?: Yes Adequate lighting in your home to reduce risk of falls?: Yes Life alert?: No Use of a cane, walker or w/c?: No Grab bars in the bathroom?: Yes Shower chair or bench in shower?: Yes Elevated toilet seat or a handicapped toilet?: Yes  TIMED UP AND GO:  Was the test performed?  No  Cognitive Function: 6CIT completed        10/25/2023    9:44 AM 10/24/2022    3:10 PM  6CIT Screen  What Year? 0 points 0 points  What month? 0 points 0 points  What time? 0 points 0 points  Count back from 20 0 points 0 points  Months in reverse 0 points 0 points  Repeat phrase 0 points 0 points  Total Score 0 points 0 points    Immunizations Immunization History  Administered Date(s) Administered   Fluad Quad(high Dose 65+) 07/27/2022   Fluad Trivalent(High Dose 65+) 05/15/2023   Moderna Sars-Covid-2 Vaccination 11/15/2019, 12/16/2019   PNEUMOCOCCAL CONJUGATE-20 05/11/2022    Screening Tests Health Maintenance  Topic Date Due   Hepatitis C Screening  Never done   DTaP/Tdap/Td (1 - Tdap) Never done   Zoster Vaccines- Shingrix (1 of 2) Never done   COVID-19 Vaccine (3 - Moderna risk series) 01/13/2020   DEXA SCAN  05/14/2024 (Originally 10/31/2021)  Colonoscopy  05/26/2024   Medicare Annual Wellness (AWV)  10/24/2024   MAMMOGRAM  04/02/2025   Pneumonia Vaccine 71+ Years old  Completed   INFLUENZA VACCINE  Completed   HPV VACCINES  Aged Out    Health Maintenance  Health Maintenance Due  Topic Date Due   Hepatitis C Screening  Never done   DTaP/Tdap/Td (1 - Tdap) Never done   Zoster Vaccines- Shingrix (1 of 2) Never done    COVID-19 Vaccine (3 - Moderna risk series) 01/13/2020   Health Maintenance Items Addressed: Hepatitis C Screening, See Nurse Notes  Additional Screening:  Vision Screening: Recommended annual ophthalmology exams for early detection of glaucoma and other disorders of the eye.  Dental Screening: Recommended annual dental exams for proper oral hygiene  Community Resource Referral / Chronic Care Management: CRR required this visit?  No   CCM required this visit?  No     Plan:     I have personally reviewed and noted the following in the patient's chart:   Medical and social history Use of alcohol, tobacco or illicit drugs  Current medications and supplements including opioid prescriptions. Patient is not currently taking opioid prescriptions. Functional ability and status Nutritional status Physical activity Advanced directives List of other physicians Hospitalizations, surgeries, and ER visits in previous 12 months Vitals Screenings to include cognitive, depression, and falls Referrals and appointments  In addition, I have reviewed and discussed with patient certain preventive protocols, quality metrics, and best practice recommendations. A written personalized care plan for preventive services as well as general preventive health recommendations were provided to patient.     Cabe Lashley L Quana Chamberlain, CMA   10/25/2023   After Visit Summary: (MyChart) Due to this being a telephonic visit, the after visit summary with patients personalized plan was offered to patient via MyChart   Notes: Please refer to Routing Comments.

## 2023-11-08 ENCOUNTER — Ambulatory Visit (INDEPENDENT_AMBULATORY_CARE_PROVIDER_SITE_OTHER)

## 2023-11-08 ENCOUNTER — Encounter: Payer: Self-pay | Admitting: Internal Medicine

## 2023-11-08 ENCOUNTER — Ambulatory Visit (INDEPENDENT_AMBULATORY_CARE_PROVIDER_SITE_OTHER): Admitting: Internal Medicine

## 2023-11-08 VITALS — BP 120/78 | HR 88 | Temp 98.4°F | Ht 63.0 in | Wt 181.0 lb

## 2023-11-08 DIAGNOSIS — M25572 Pain in left ankle and joints of left foot: Secondary | ICD-10-CM | POA: Diagnosis not present

## 2023-11-08 DIAGNOSIS — M7732 Calcaneal spur, left foot: Secondary | ICD-10-CM | POA: Diagnosis not present

## 2023-11-08 DIAGNOSIS — E7800 Pure hypercholesterolemia, unspecified: Secondary | ICD-10-CM | POA: Diagnosis not present

## 2023-11-08 DIAGNOSIS — M79672 Pain in left foot: Secondary | ICD-10-CM | POA: Insufficient documentation

## 2023-11-08 DIAGNOSIS — E559 Vitamin D deficiency, unspecified: Secondary | ICD-10-CM

## 2023-11-08 DIAGNOSIS — T7840XD Allergy, unspecified, subsequent encounter: Secondary | ICD-10-CM

## 2023-11-08 DIAGNOSIS — M2012 Hallux valgus (acquired), left foot: Secondary | ICD-10-CM | POA: Diagnosis not present

## 2023-11-08 DIAGNOSIS — M19072 Primary osteoarthritis, left ankle and foot: Secondary | ICD-10-CM | POA: Diagnosis not present

## 2023-11-08 NOTE — Patient Instructions (Addendum)
 I suspect that you have a Ganglion Cyst tendon problem to the left foot  Ok to use the OTC Voltaren gel (topical anti inflammatory) for pain  Please continue all other medications as before, and refills have been done if requested.  Please have the pharmacy call with any other refills you may need.  Please keep your appointments with your specialists as you may have planned  Please go to the XRAY Department in the first floor for the x-ray testing  You will be contacted by phone if any changes need to be made immediately.  Otherwise, you will receive a letter about your results with an explanation, but please check with MyChart first.  You will be contacted regarding the referral for: Dr Victorino Dike - [facility]  Please make an Appointment to return in 6 months, or sooner if needed, also with Lab Appointment for testing done 3-5 days before at the FIRST FLOOR Lab (so this is for TWO appointments - please see the scheduling desk as you leave)

## 2023-11-08 NOTE — Progress Notes (Signed)
 Patient ID: Jessica Macdonald, female   DOB: 11/16/1955, 68 y.o.   MRN: [id number]        Chief Complaint: follow up mid foot dorsal pain swelling ? ganglion cyst vs other x 6 days        HPI:  Jessica Macdonald is a 68 y.o. female  pt has mid foot dorsal pain swelling ? ganglion cyst vs other x 6 days , pain about 8/10, worse to walk, better to sit, no recent trauma or misstep;  Pt denies chest pain, increased sob or doe, wheezing, orthopnea, PND, increased LE swelling, palpitations, dizziness or syncope.   Pt denies polydipsia, polyuria, or new focal neuro s/s.    Pt denies fever, wt loss, night sweats, loss of appetite, or other constitutional symptoms  Does have several wks ongoing nasal allergy symptoms with clearish congestion, itch and sneezing, without fever, pain, ST, cough, swelling or wheezing. Wt Readings from Last 3 Encounters:  11/08/23 181 lb (82.1 kg)  10/25/23 175 lb (79.4 kg)  05/15/23 199 lb (90.3 kg)   BP Readings from Last 3 Encounters:  11/08/23 120/78  05/15/23 120/72  05/11/22 120/68         Past Medical History:  Diagnosis Date   Allergy    seasonal   Cancer (HCC)    melanoma   Elevated coronary artery calcium score 05/05/2021   Family history of colon cancer 05/05/2021   Family history of colon cancer    Family history of melanoma    Family history of prostate cancer    HLD (hyperlipidemia) 05/05/2021   Melanoma (HCC) 05/05/2021   Past Surgical History:  Procedure Laterality Date   carpel tunnel     CHOLECYSTECTOMY     2017 February   MELANOMA EXCISION  2009   right upper arm    reports that she has never smoked. She has been exposed to tobacco smoke. She has never used smokeless tobacco. She reports current alcohol use of about 1.0 standard drink of alcohol per week. She reports that she does not use drugs. family history includes BRCA 1/2 in her brother; Colon cancer in her mother and paternal grandmother; Head & neck cancer (age of onset: 53) in her  brother; Heart disease in her father; Melanoma in her sister; Prostate cancer in her father; Prostate cancer (age of onset: 70) in her brother; Throat cancer in her paternal grandfather. No Known Allergies Current Outpatient Medications on File Prior to Visit  Medication Sig Dispense Refill   b complex vitamins tablet Take 1 tablet by mouth daily.     fish oil-omega-3 fatty acids 1000 MG capsule Take 1 g by mouth daily.     ibuprofen (ADVIL) 200 MG tablet Take 200 mg by mouth every 6 (six) hours as needed.     Multiple Vitamins-Minerals (ONE-A-DAY VITACRAVES ADULT) CHEW Chew by mouth.     phentermine 30 MG capsule Take 1 capsule (30 mg total) by mouth every morning. 90 capsule 1   Probiotic Product (PROBIOTIC & ACIDOPHILUS EX ST PO) Take 1 tablet by mouth daily.     HYDROcodone bit-homatropine (HYCODAN) 5-1.5 MG/5ML syrup Take 5 mLs by mouth every 6 (six) hours as needed for cough. (Patient not taking: Reported on 10/24/2022) 90 mL 0   No current facility-administered medications on file prior to visit.        ROS:  All others reviewed and negative.  Objective        PE:  BP 120/78 (BP Location:  Right Arm, Patient Position: Sitting, Cuff Size: Normal)   Pulse 88   Temp 98.4 F (36.9 C) (Oral)   Ht 5\' 3"  (1.6 m)   Wt 181 lb (82.1 kg)   SpO2 98%   BMI 32.06 kg/m                 Constitutional: Pt appears in NAD               HENT: Head: NCAT.                Right Ear: External ear normal.                 Left Ear: External ear normal.                Eyes: . Pupils are equal, round, and reactive to light. Conjunctivae and EOM are normal               Nose: without d/c or deformity               Neck: Neck supple. Gross normal ROM               Cardiovascular: Normal rate and regular rhythm.                 Pulmonary/Chest: Effort normal and breath sounds without rales or wheezing.                Abd:  Soft, NT, ND, + BS, no organomegaly               Neurological: Pt is alert. At  baseline orientation, motor grossly intact               Skin: Skin is warm. No rashes,  LE edema - none, left dorsal mid foot with spongy mass raised with marked tender without overlying skin change                Psychiatric: Pt behavior is normal without agitation   Micro: none  Cardiac tracings I have personally interpreted today:  none  Pertinent Radiological findings (summarize): none   Lab Results  Component Value Date   WBC 8.3 05/09/2023   HGB 13.2 05/09/2023   HCT 41.2 05/09/2023   PLT 261.0 05/09/2023   GLUCOSE 102 (H) 05/09/2023   CHOL 250 (H) 05/09/2023   TRIG 252.0 (H) 05/09/2023   HDL 52.00 05/09/2023   LDLDIRECT 211.0 05/06/2022   LDLCALC 148 (H) 05/09/2023   ALT 18 05/09/2023   AST 17 05/09/2023   NA 136 05/09/2023   K 4.4 05/09/2023   CL 103 05/09/2023   CREATININE 0.79 05/09/2023   BUN 17 05/09/2023   CO2 27 05/09/2023   TSH 3.86 05/09/2023   HGBA1C 5.9 05/09/2023   Assessment/Plan:  Jessica Macdonald is a 68 y.o. White or Caucasian [1] female with  has a past medical history of Allergy, Cancer (HCC), Elevated coronary artery calcium score (05/05/2021), Family history of colon cancer (05/05/2021), Family history of colon cancer, Family history of melanoma, Family history of prostate cancer, HLD (hyperlipidemia) (05/05/2021), and Melanoma (HCC) (05/05/2021).  Pain of left midfoot I suspect c/w ganglion cyst tendonitis, for volt gel prn, xray r/o fx, and refer emergeortho Dr Victorino Dike  Vitamin D deficiency Last vitamin D Lab Results  Component Value Date   VD25OH 21.91 (L) 05/09/2023   Low, to start oral replacement   HLD (hyperlipidemia) Lab Results  Component Value Date  LDLCALC 148 (H) 05/09/2023   Uncontrolled,, pt for lower chol diet, declines statin or other, and for f/u lab next visit  Allergy For otc allegra and /or nasacort asd,  Followup: Return in about 6 months (around 05/10/2024).  Oliver Barre, MD 11/11/2023 6:20 PM [facility]

## 2023-11-11 ENCOUNTER — Encounter: Payer: Self-pay | Admitting: Internal Medicine

## 2023-11-11 NOTE — Assessment & Plan Note (Signed)
 Lab Results  Component Value Date   LDLCALC 148 (H) 05/09/2023   Uncontrolled,, pt for lower chol diet, declines statin or other, and for f/u lab next visit

## 2023-11-11 NOTE — Assessment & Plan Note (Signed)
 I suspect c/w ganglion cyst tendonitis, for volt gel prn, xray r/o fx, and refer emergeortho Dr Victorino Dike

## 2023-11-11 NOTE — Assessment & Plan Note (Signed)
 Last vitamin D Lab Results  Component Value Date   VD25OH 21.91 (L) 05/09/2023   Low, to start oral replacement

## 2023-11-11 NOTE — Assessment & Plan Note (Signed)
 For otc allegra and /or nasacort asd,

## 2023-11-14 ENCOUNTER — Telehealth: Payer: Self-pay | Admitting: Internal Medicine

## 2023-11-14 NOTE — Telephone Encounter (Signed)
 Sorry, I dont know what to do with this statement.  thanks

## 2023-11-14 NOTE — Telephone Encounter (Unsigned)
 Copied from CRM [id number]. Topic: General - Other >> Nov 13, 2023  4:50 PM Eunice Blase wrote: Reason for CRM: Pt called would like a call back for DG Foot Complete Left (Order [id number]). Please call pt at [phone].

## 2023-11-15 ENCOUNTER — Encounter: Payer: Self-pay | Admitting: Internal Medicine

## 2023-11-24 DIAGNOSIS — M19072 Primary osteoarthritis, left ankle and foot: Secondary | ICD-10-CM | POA: Diagnosis not present

## 2023-12-04 NOTE — Telephone Encounter (Signed)
 Sorry, I dont other to offer for now, except if she wants zepbound 2.5 mg weekly  - I can send this to [facility] but the cost I believe is about $600

## 2024-03-14 DIAGNOSIS — H5213 Myopia, bilateral: Secondary | ICD-10-CM | POA: Diagnosis not present

## 2024-03-14 DIAGNOSIS — H524 Presbyopia: Secondary | ICD-10-CM | POA: Diagnosis not present

## 2024-03-14 DIAGNOSIS — H25813 Combined forms of age-related cataract, bilateral: Secondary | ICD-10-CM | POA: Diagnosis not present

## 2024-03-28 DIAGNOSIS — D485 Neoplasm of uncertain behavior of skin: Secondary | ICD-10-CM | POA: Diagnosis not present

## 2024-03-28 DIAGNOSIS — D2272 Melanocytic nevi of left lower limb, including hip: Secondary | ICD-10-CM | POA: Diagnosis not present

## 2024-03-28 DIAGNOSIS — Z87898 Personal history of other specified conditions: Secondary | ICD-10-CM | POA: Diagnosis not present

## 2024-03-28 DIAGNOSIS — D225 Melanocytic nevi of trunk: Secondary | ICD-10-CM | POA: Diagnosis not present

## 2024-03-28 DIAGNOSIS — L821 Other seborrheic keratosis: Secondary | ICD-10-CM | POA: Diagnosis not present

## 2024-03-28 DIAGNOSIS — D4989 Neoplasm of unspecified behavior of other specified sites: Secondary | ICD-10-CM | POA: Diagnosis not present

## 2024-03-28 DIAGNOSIS — D2362 Other benign neoplasm of skin of left upper limb, including shoulder: Secondary | ICD-10-CM | POA: Diagnosis not present

## 2024-03-28 DIAGNOSIS — L578 Other skin changes due to chronic exposure to nonionizing radiation: Secondary | ICD-10-CM | POA: Diagnosis not present

## 2024-03-28 DIAGNOSIS — D2262 Melanocytic nevi of left upper limb, including shoulder: Secondary | ICD-10-CM | POA: Diagnosis not present

## 2024-03-28 DIAGNOSIS — D171 Benign lipomatous neoplasm of skin and subcutaneous tissue of trunk: Secondary | ICD-10-CM | POA: Diagnosis not present

## 2024-03-28 DIAGNOSIS — D2271 Melanocytic nevi of right lower limb, including hip: Secondary | ICD-10-CM | POA: Diagnosis not present

## 2024-04-08 DIAGNOSIS — Z1231 Encounter for screening mammogram for malignant neoplasm of breast: Secondary | ICD-10-CM | POA: Diagnosis not present

## 2024-04-08 DIAGNOSIS — Z124 Encounter for screening for malignant neoplasm of cervix: Secondary | ICD-10-CM | POA: Diagnosis not present

## 2024-04-08 DIAGNOSIS — Z6832 Body mass index (BMI) 32.0-32.9, adult: Secondary | ICD-10-CM | POA: Diagnosis not present

## 2024-05-09 ENCOUNTER — Other Ambulatory Visit

## 2024-05-09 DIAGNOSIS — E559 Vitamin D deficiency, unspecified: Secondary | ICD-10-CM | POA: Diagnosis not present

## 2024-05-09 DIAGNOSIS — E7800 Pure hypercholesterolemia, unspecified: Secondary | ICD-10-CM | POA: Diagnosis not present

## 2024-05-09 DIAGNOSIS — R739 Hyperglycemia, unspecified: Secondary | ICD-10-CM

## 2024-05-09 DIAGNOSIS — E538 Deficiency of other specified B group vitamins: Secondary | ICD-10-CM | POA: Diagnosis not present

## 2024-05-09 LAB — CBC WITH DIFFERENTIAL/PLATELET
Basophils Absolute: 0.1 K/uL (ref 0.0–0.1)
Basophils Relative: 0.9 % (ref 0.0–3.0)
Eosinophils Absolute: 0.3 K/uL (ref 0.0–0.7)
Eosinophils Relative: 3.7 % (ref 0.0–5.0)
HCT: 39.4 % (ref 36.0–46.0)
Hemoglobin: 12.9 g/dL (ref 12.0–15.0)
Lymphocytes Relative: 34.2 % (ref 12.0–46.0)
Lymphs Abs: 2.3 K/uL (ref 0.7–4.0)
MCHC: 32.8 g/dL (ref 30.0–36.0)
MCV: 89.2 fl (ref 78.0–100.0)
Monocytes Absolute: 0.3 K/uL (ref 0.1–1.0)
Monocytes Relative: 4.8 % (ref 3.0–12.0)
Neutro Abs: 3.9 K/uL (ref 1.4–7.7)
Neutrophils Relative %: 56.4 % (ref 43.0–77.0)
Platelets: 266 K/uL (ref 150.0–400.0)
RBC: 4.42 Mil/uL (ref 3.87–5.11)
RDW: 13.9 % (ref 11.5–15.5)
WBC: 6.8 K/uL (ref 4.0–10.5)

## 2024-05-09 LAB — URINALYSIS, ROUTINE W REFLEX MICROSCOPIC
Bilirubin Urine: NEGATIVE
Hgb urine dipstick: NEGATIVE
Ketones, ur: NEGATIVE
Nitrite: NEGATIVE
RBC / HPF: NONE SEEN (ref 0–?)
Specific Gravity, Urine: 1.02 (ref 1.000–1.030)
Total Protein, Urine: NEGATIVE
Urine Glucose: NEGATIVE
Urobilinogen, UA: 0.2 (ref 0.0–1.0)
pH: 6 (ref 5.0–8.0)

## 2024-05-09 LAB — LIPID PANEL
Cholesterol: 243 mg/dL — ABNORMAL HIGH (ref 0–200)
HDL: 57.1 mg/dL (ref >39.00)
LDL Cholesterol: 157 mg/dL — ABNORMAL HIGH (ref 0–99)
NonHDL: 186.13
Total CHOL/HDL Ratio: 4
Triglycerides: 144 mg/dL (ref 0.0–149.0)
VLDL: 28.8 mg/dL (ref 0.0–40.0)

## 2024-05-09 LAB — BASIC METABOLIC PANEL WITH GFR
BUN: 15 mg/dL (ref 6–23)
CO2: 26 meq/L (ref 19–32)
Calcium: 9.4 mg/dL (ref 8.4–10.5)
Chloride: 104 meq/L (ref 96–112)
Creatinine, Ser: 0.69 mg/dL (ref 0.40–1.20)
GFR: 89.74 mL/min (ref >60.00)
Glucose, Bld: 96 mg/dL (ref 70–99)
Potassium: 4.1 meq/L (ref 3.5–5.1)
Sodium: 137 meq/L (ref 135–145)

## 2024-05-09 LAB — VITAMIN D 25 HYDROXY (VIT D DEFICIENCY, FRACTURES): VITD: 30.4 ng/mL (ref 30.00–100.00)

## 2024-05-09 LAB — HEPATIC FUNCTION PANEL
ALT: 22 U/L (ref 0–35)
AST: 20 U/L (ref 0–37)
Albumin: 4.3 g/dL (ref 3.5–5.2)
Alkaline Phosphatase: 119 U/L — ABNORMAL HIGH (ref 39–117)
Bilirubin, Direct: 0.1 mg/dL (ref 0.0–0.3)
Total Bilirubin: 0.5 mg/dL (ref 0.2–1.2)
Total Protein: 7.1 g/dL (ref 6.0–8.3)

## 2024-05-09 LAB — HEMOGLOBIN A1C: Hgb A1c MFr Bld: 6.1 % (ref 4.6–6.5)

## 2024-05-09 LAB — TSH: TSH: 2.92 u[IU]/mL (ref 0.35–5.50)

## 2024-05-09 LAB — VITAMIN B12: Vitamin B-12: 503 pg/mL (ref 211–911)

## 2024-05-16 ENCOUNTER — Ambulatory Visit: Payer: Medicare HMO | Admitting: Internal Medicine

## 2024-05-16 ENCOUNTER — Encounter: Payer: Self-pay | Admitting: Internal Medicine

## 2024-05-16 VITALS — BP 108/72 | HR 85 | Temp 98.2°F | Ht 63.0 in | Wt 185.4 lb

## 2024-05-16 DIAGNOSIS — E559 Vitamin D deficiency, unspecified: Secondary | ICD-10-CM

## 2024-05-16 DIAGNOSIS — E7800 Pure hypercholesterolemia, unspecified: Secondary | ICD-10-CM | POA: Diagnosis not present

## 2024-05-16 DIAGNOSIS — R739 Hyperglycemia, unspecified: Secondary | ICD-10-CM

## 2024-05-16 DIAGNOSIS — Z0001 Encounter for general adult medical examination with abnormal findings: Secondary | ICD-10-CM

## 2024-05-16 DIAGNOSIS — E538 Deficiency of other specified B group vitamins: Secondary | ICD-10-CM

## 2024-05-16 DIAGNOSIS — Z23 Encounter for immunization: Secondary | ICD-10-CM

## 2024-05-16 DIAGNOSIS — R9431 Abnormal electrocardiogram [ECG] [EKG]: Secondary | ICD-10-CM | POA: Diagnosis not present

## 2024-05-16 DIAGNOSIS — Z860100 Personal history of colon polyps, unspecified: Secondary | ICD-10-CM | POA: Diagnosis not present

## 2024-05-16 DIAGNOSIS — E66811 Obesity, class 1: Secondary | ICD-10-CM | POA: Diagnosis not present

## 2024-05-16 MED ORDER — PHENTERMINE HCL 30 MG PO CAPS: 30.0000 mg | ORAL_CAPSULE | ORAL | 1 refills | Status: DC

## 2024-05-16 NOTE — Assessment & Plan Note (Signed)
 Age and sex appropriate education and counseling updated with regular exercise and diet Referrals for preventative services - for colonoscopy Immunizations addressed - for flu shot today Smoking counseling  - none needed Evidence for depression or other mood disorder - none significant Most recent labs reviewed. I have personally reviewed and have noted: 1) the patient's medical and social history 2) The patient's current medications and supplements 3) The patient's height, weight, and BMI have been recorded in the chart

## 2024-05-16 NOTE — Assessment & Plan Note (Signed)
 Improved but regaining some wt - ok for phentermine  30 mg limited rx

## 2024-05-16 NOTE — Assessment & Plan Note (Addendum)
 Lab Results  Component Value Date   LDLCALC 157 (H) 05/09/2024   Uncontrolled, declines statin due to 2021 Card Ct score of 0.5, pt to continue low chol diet, but pt also asks for repeat Card CT Score

## 2024-05-16 NOTE — Assessment & Plan Note (Addendum)
 Last vitamin D  Lab Results  Component Value Date   VD25OH 30.40 05/09/2024   Low, taking 2000 u every day, pt now to increase oral replacement to 4000 u qd

## 2024-05-16 NOTE — Patient Instructions (Addendum)
 Please have your Shingrix (shingles) shots done at your local pharmacy., and the Tdap tetanus as well  You had the flu shot today  Please continue all other medications as before, including to restart the Phentermine   Please have the pharmacy call with any other refills you may need.  Please continue your efforts at being more active, low cholesterol diet, and weight control.  You are otherwise up to date with prevention measures today.  Please keep your appointments with your specialists as you may have planned  You will be contacted regarding the referral for: colonoscopy, and Card CT score  Your lab work was otherwise very Good!  Please make an Appointment to return for your 1 year visit, or sooner if needed, with Lab testing by Appointment as well, to be done about 3-5 days before at the FIRST FLOOR Lab (so this is for TWO appointments - please see the scheduling desk as you leave)

## 2024-05-16 NOTE — Progress Notes (Signed)
 Patient ID: Jessica Macdonald, female   DOB: April 11, 1956, 68 y.o.   MRN: [id number]         Chief Complaint:: wellness exam and low vit d, hld, obesity       HPI:  Jessica Macdonald is a 68 y.o. female here for wellness exam; due for colonoscopy, for flu shot today, o/w up to date                        Also walks 3 miles x 3 days per wk. Pt denies chest pain, increased sob or doe, wheezing, orthopnea, PND, increased LE swelling, palpitations, dizziness or syncope.   Pt denies polydipsia, polyuria, or new focal neuro s/s.    Pt denies fever, wt loss, night sweats, loss of appetite, or other constitutional symptoms     Wt Readings from Last 3 Encounters:  05/16/24 185 lb 6.4 oz (84.1 kg)  11/08/23 181 lb (82.1 kg)  10/25/23 175 lb (79.4 kg)   BP Readings from Last 3 Encounters:  05/16/24 108/72  11/08/23 120/78  05/15/23 120/72   Immunization History  Administered Date(s) Administered   Fluad Quad(high Dose 65+) 07/27/2022   Fluad Trivalent(High Dose 65+) 05/15/2023   INFLUENZA, HIGH DOSE SEASONAL PF 05/16/2024   Moderna Sars-Covid-2 Vaccination 11/15/2019, 12/16/2019   PNEUMOCOCCAL CONJUGATE-20 05/11/2022   Health Maintenance Due  Topic Date Due   DTaP/Tdap/Td (1 - Tdap) Never done   Zoster Vaccines- Shingrix (1 of 2) Never done   DEXA SCAN  Never done   Colonoscopy  05/26/2024      Past Medical History:  Diagnosis Date   Allergy    seasonal   Cancer (HCC)    melanoma   Elevated coronary artery calcium score 05/05/2021   Family history of colon cancer 05/05/2021   Family history of colon cancer    Family history of melanoma    Family history of prostate cancer    HLD (hyperlipidemia) 05/05/2021   Melanoma (HCC) 05/05/2021   Past Surgical History:  Procedure Laterality Date   carpel tunnel     CHOLECYSTECTOMY     2017 February   MELANOMA EXCISION  2009   right upper arm    reports that she has never smoked. She has been exposed to tobacco smoke. She has never used  smokeless tobacco. She reports current alcohol use of about 1.0 standard drink of alcohol per week. She reports that she does not use drugs. family history includes BRCA 1/2 in her brother; Colon cancer in her mother and paternal grandmother; Head & neck cancer (age of onset: 41) in her brother; Heart disease in her father; Melanoma in her sister; Prostate cancer in her father; Prostate cancer (age of onset: 51) in her brother; Throat cancer in her paternal grandfather. No Known Allergies Current Outpatient Medications on File Prior to Visit  Medication Sig Dispense Refill   b complex vitamins tablet Take 1 tablet by mouth daily.     fish oil-omega-3 fatty acids 1000 MG capsule Take 1 g by mouth daily.     ibuprofen (ADVIL) 200 MG tablet Take 200 mg by mouth every 6 (six) hours as needed.     Multiple Vitamins-Minerals (ONE-A-DAY VITACRAVES ADULT) CHEW Chew by mouth.     Probiotic Product (PROBIOTIC & ACIDOPHILUS EX ST PO) Take 1 tablet by mouth daily.     No current facility-administered medications on file prior to visit.        ROS:  All others reviewed and negative.  Objective        PE:  BP 108/72   Pulse 85   Temp 98.2 F (36.8 C)   Ht 5' 3 (1.6 m)   Wt 185 lb 6.4 oz (84.1 kg)   SpO2 97%   BMI 32.84 kg/m                 Constitutional: Pt appears in NAD               HENT: Head: NCAT.                Right Ear: External ear normal.                 Left Ear: External ear normal.                Eyes: . Pupils are equal, round, and reactive to light. Conjunctivae and EOM are normal               Nose: without d/c or deformity               Neck: Neck supple. Gross normal ROM               Cardiovascular: Normal rate and regular rhythm.                 Pulmonary/Chest: Effort normal and breath sounds without rales or wheezing.                Abd:  Soft, NT, ND, + BS, no organomegaly               Neurological: Pt is alert. At baseline orientation, motor grossly intact                Skin: Skin is warm. No rashes, no other new lesions, LE edema - none               Psychiatric: Pt behavior is normal without agitation   Micro: none  Cardiac tracings I have personally interpreted today:  none  Pertinent Radiological findings (summarize): none   Lab Results  Component Value Date   WBC 6.8 05/09/2024   HGB 12.9 05/09/2024   HCT 39.4 05/09/2024   PLT 266.0 05/09/2024   GLUCOSE 96 05/09/2024   CHOL 243 (H) 05/09/2024   TRIG 144.0 05/09/2024   HDL 57.10 05/09/2024   LDLDIRECT 211.0 05/06/2022   LDLCALC 157 (H) 05/09/2024   ALT 22 05/09/2024   AST 20 05/09/2024   NA 137 05/09/2024   K 4.1 05/09/2024   CL 104 05/09/2024   CREATININE 0.69 05/09/2024   BUN 15 05/09/2024   CO2 26 05/09/2024   TSH 2.92 05/09/2024   HGBA1C 6.1 05/09/2024   Assessment/Plan:  Jessica Macdonald is a 68 y.o. White or Caucasian [1] female with  has a past medical history of Allergy, Cancer (HCC), Elevated coronary artery calcium score (05/05/2021), Family history of colon cancer (05/05/2021), Family history of colon cancer, Family history of melanoma, Family history of prostate cancer, HLD (hyperlipidemia) (05/05/2021), and Melanoma (HCC) (05/05/2021).  Encounter for well adult exam with abnormal findings Age and sex appropriate education and counseling updated with regular exercise and diet Referrals for preventative services - for colonoscopy Immunizations addressed - for flu shot today Smoking counseling  - none needed Evidence for depression or other mood disorder - none significant Most recent labs reviewed. I have personally reviewed and have noted: 1)  the patient's medical and social history 2) The patient's current medications and supplements 3) The patient's height, weight, and BMI have been recorded in the chart   Vitamin D  deficiency Last vitamin D  Lab Results  Component Value Date   VD25OH 30.40 05/09/2024   Low, taking 2000 u every day, pt now to increase oral  replacement to 4000 u qd   Obesity (BMI 30.0-34.9) Improved but regaining some wt - ok for phentermine  30 mg limited rx  HLD (hyperlipidemia) Lab Results  Component Value Date   LDLCALC 157 (H) 05/09/2024   Uncontrolled, declines statin due to 2021 Card Ct score of 0.5, pt to continue low chol diet, but pt also asks for repeat Card CT Score  Followup: Return in about 1 year (around 05/16/2025).  Lynwood Rush, MD 05/16/2024 7:52 PM [facility]

## 2024-05-27 ENCOUNTER — Encounter: Payer: Self-pay | Admitting: Internal Medicine

## 2024-05-29 ENCOUNTER — Ambulatory Visit (HOSPITAL_COMMUNITY)
Admission: RE | Admit: 2024-05-29 | Discharge: 2024-05-29 | Disposition: A | Discharge status: Home / Self Care | Payer: Self-pay | Source: Ambulatory Visit | Attending: Internal Medicine | Admitting: Internal Medicine

## 2024-05-29 DIAGNOSIS — R739 Hyperglycemia, unspecified: Secondary | ICD-10-CM | POA: Insufficient documentation

## 2024-05-29 DIAGNOSIS — R9431 Abnormal electrocardiogram [ECG] [EKG]: Secondary | ICD-10-CM | POA: Insufficient documentation

## 2024-05-29 DIAGNOSIS — E7800 Pure hypercholesterolemia, unspecified: Secondary | ICD-10-CM | POA: Insufficient documentation

## 2024-05-29 DIAGNOSIS — E66811 Obesity, class 1: Secondary | ICD-10-CM | POA: Insufficient documentation

## 2024-05-30 ENCOUNTER — Encounter: Payer: Self-pay | Admitting: Internal Medicine

## 2024-05-30 ENCOUNTER — Other Ambulatory Visit: Payer: Self-pay | Admitting: Internal Medicine

## 2024-05-30 ENCOUNTER — Ambulatory Visit: Payer: Self-pay | Admitting: Internal Medicine

## 2024-05-30 DIAGNOSIS — R931 Abnormal findings on diagnostic imaging of heart and coronary circulation: Secondary | ICD-10-CM

## 2024-05-30 MED ORDER — ROSUVASTATIN CALCIUM 10 MG PO TABS: 10.0000 mg | ORAL_TABLET | Freq: Every day | ORAL | 3 refills | Status: AC

## 2024-05-30 MED ORDER — ASPIRIN 81 MG PO TBEC: 81.0000 mg | DELAYED_RELEASE_TABLET | Freq: Every day | ORAL | Status: AC

## 2024-06-04 DIAGNOSIS — L988 Other specified disorders of the skin and subcutaneous tissue: Secondary | ICD-10-CM | POA: Diagnosis not present

## 2024-06-04 DIAGNOSIS — D485 Neoplasm of uncertain behavior of skin: Secondary | ICD-10-CM | POA: Diagnosis not present

## 2024-06-06 ENCOUNTER — Other Ambulatory Visit: Payer: Self-pay | Admitting: Internal Medicine

## 2024-06-06 DIAGNOSIS — R931 Abnormal findings on diagnostic imaging of heart and coronary circulation: Secondary | ICD-10-CM

## 2024-06-18 ENCOUNTER — Ambulatory Visit (AMBULATORY_SURGERY_CENTER): Admitting: *Deleted

## 2024-06-18 VITALS — Ht 63.0 in | Wt 180.0 lb

## 2024-06-18 DIAGNOSIS — Z800 Family history of malignant neoplasm of digestive organs: Secondary | ICD-10-CM

## 2024-06-18 DIAGNOSIS — Z860100 Personal history of colon polyps, unspecified: Secondary | ICD-10-CM

## 2024-06-18 MED ORDER — NA SULFATE-K SULFATE-MG SULF 17.5-3.13-1.6 GM/177ML PO SOLN: 1.0000 | Freq: Once | ORAL | 0 refills | Status: AC

## 2024-06-18 NOTE — Progress Notes (Signed)
 Pt's name and DOB verified at the beginning of the pre-visit with 2 identifiers  Pt denies any difficulty with ambulating,sitting, laying down or rolling side to side  Pt has no issues moving head neck or swallowing  No egg or soy allergy known to patient   No issues known to pt with past sedation  No FH of Malignant Hyperthermia  Pt is not on home 02   Pt is not on blood thinners   Pt denies issues with constipation   Pt is not on dialysis  Pt denise any abnormal heart rhythms   Pt denies any upcoming cardiac testing  Patient's chart reviewed by Norleen Schillings CNRA prior to pre-visit and patient appropriate for the [facility].  Pre-visit completed and red dot placed by patient's name on their procedure day (on provider's schedule).    Visit in person  Pt states weight is 180 lb   Pt given  both [facility] main # and MD on call # prior to instructions.  Informed pt to come in at the time discussed and is shown on PV instructions.  Pt instructed to use Singlecare.com or GoodRx for a price reduction on prep  Instructed pt where to find PV instructions in My Ch. Copy of instructions given to pt  to be sent in mail a Instructed pt on all aspects of written instructions including med holds clothing to wear and foods to eat and not eat as well as after procedure legal restrictions and to call MD on call if needed.. Pt states understanding. Instructed pt to review instructions again prior to procedure and call main # given if has any questions or any issues. Pt states they will.

## 2024-06-25 ENCOUNTER — Encounter: Payer: Self-pay | Admitting: Internal Medicine

## 2024-06-27 ENCOUNTER — Telehealth: Payer: Self-pay

## 2024-06-27 NOTE — Telephone Encounter (Signed)
 Copied from CRM #[id number]. Topic: Appointments - Transfer of Care >> Jun 27, 2024  2:03 PM Berneda FALCON wrote: Pt is requesting to transfer FROM: Dr. Norleen Pt is requesting to transfer TO: Lynwood Crandall Reason for requested transfer: PCP retiring It is the responsibility of the team the patient would like to transfer to (Dr. Crandall) to reach out to the patient if for any reason this transfer is not acceptable.

## 2024-06-27 NOTE — Telephone Encounter (Signed)
 Transfer to me is fine.  As the patient already has an appointment scheduled for 09/04/2024.

## 2024-07-02 ENCOUNTER — Ambulatory Visit: Admitting: Internal Medicine

## 2024-07-02 ENCOUNTER — Encounter: Payer: Self-pay | Admitting: Internal Medicine

## 2024-07-02 VITALS — BP 122/81 | HR 72 | Temp 97.8°F | Resp 14 | Ht 63.0 in | Wt 180.0 lb

## 2024-07-02 DIAGNOSIS — E785 Hyperlipidemia, unspecified: Secondary | ICD-10-CM | POA: Diagnosis not present

## 2024-07-02 DIAGNOSIS — D122 Benign neoplasm of ascending colon: Secondary | ICD-10-CM

## 2024-07-02 DIAGNOSIS — K5730 Diverticulosis of large intestine without perforation or abscess without bleeding: Secondary | ICD-10-CM

## 2024-07-02 DIAGNOSIS — Z1211 Encounter for screening for malignant neoplasm of colon: Secondary | ICD-10-CM | POA: Diagnosis not present

## 2024-07-02 DIAGNOSIS — Z800 Family history of malignant neoplasm of digestive organs: Secondary | ICD-10-CM | POA: Diagnosis not present

## 2024-07-02 DIAGNOSIS — Z860100 Personal history of colon polyps, unspecified: Secondary | ICD-10-CM

## 2024-07-02 DIAGNOSIS — Z860101 Personal history of adenomatous and serrated colon polyps: Secondary | ICD-10-CM

## 2024-07-02 MED ORDER — SODIUM CHLORIDE 0.9 % IV SOLN: 500.0000 mL | INTRAVENOUS | Status: DC

## 2024-07-02 NOTE — Progress Notes (Signed)
 Sedate, gd SR, tolerated procedure well, VSS, report to RN

## 2024-07-02 NOTE — Progress Notes (Signed)
 Pt's states no medical or surgical changes since previsit or office visit.

## 2024-07-02 NOTE — Progress Notes (Signed)
 Called to room to assist during endoscopic procedure.  Patient ID and intended procedure confirmed with present staff. Received instructions for my participation in the procedure from the performing physician.

## 2024-07-02 NOTE — Progress Notes (Signed)
 HISTORY OF PRESENT ILLNESS:  Jessica Macdonald is a 68 y.o. female with a history of multiple adenomatous colon polyps.  Presents today for surveillance colonoscopy.  No complaints  REVIEW OF SYSTEMS:  All non-GI ROS negative except for  Past Medical History:  Diagnosis Date   Allergy    seasonal   Arthritis    Cancer (HCC)    melanoma   Elevated coronary artery calcium score 05/05/2021   Family history of colon cancer 05/05/2021   Family history of colon cancer    Family history of melanoma    Family history of prostate cancer    HLD (hyperlipidemia) 05/05/2021   Melanoma (HCC) 05/05/2021    Past Surgical History:  Procedure Laterality Date   carpel tunnel     CHOLECYSTECTOMY     2017 February   COLONOSCOPY     MELANOMA EXCISION  2009   right upper arm    Social History Jessica Macdonald  reports that she has never smoked. She has been exposed to tobacco smoke. She has never used smokeless tobacco. She reports current alcohol use of about 1.0 standard drink of alcohol per week. She reports that she does not use drugs.  family history includes BRCA 1/2 in her brother; Colon cancer in her mother and paternal grandmother; Head & neck cancer (age of onset: 26) in her brother; Heart disease in her father; Melanoma in her sister; Prostate cancer in her father; Prostate cancer (age of onset: 8) in her brother; Throat cancer in her paternal grandfather.  No Known Allergies     PHYSICAL EXAMINATION: Vital signs: BP 128/75   Pulse 86   Temp 97.8 F (36.6 C) (Temporal)   Ht 5' 3 (1.6 m)   Wt 180 lb (81.6 kg)   SpO2 94%   BMI 31.89 kg/m  General: Well-developed, well-nourished, no acute distress HEENT: Sclerae are anicteric, conjunctiva pink. Oral mucosa intact Lungs: Clear Heart: Regular Abdomen: soft, nontender, nondistended, no obvious ascites, no peritoneal signs, normal bowel sounds. No organomegaly. Extremities: No edema Psychiatric: alert and oriented x3.  Cooperative     ASSESSMENT:  History of multiple adenomatous colon polyps   PLAN:  Surveillance colonoscopy

## 2024-07-02 NOTE — Op Note (Signed)
 [facility] Patient Name: Jessica Macdonald Procedure Date: 07/02/2024 8:38 AM MRN: [id number] Endoscopist: Norleen SAILOR. Abran , MD, [id number] Age: 68 Referring MD:  Date of Birth: 1955/11/17 Gender: Female Account #: [id number] Procedure:                Colonoscopy with cold snare polypectomy x 1 Indications:              High risk colon cancer surveillance: Personal                            history of multiple (3 or more) adenomas. Previous                            examinations 2001, 2006, 2013, 2018, 2020 Medicines:                Monitored Anesthesia Care Procedure:                Pre-Anesthesia Assessment:                           - Prior to the procedure, a History and Physical                            was performed, and patient medications and                            allergies were reviewed. The patient's tolerance of                            previous anesthesia was also reviewed. The risks                            and benefits of the procedure and the sedation                            options and risks were discussed with the patient.                            All questions were answered, and informed consent                            was obtained. Prior Anticoagulants: The patient has                            taken no anticoagulant or antiplatelet agents. ASA                            Grade Assessment: II - A patient with mild systemic                            disease. After reviewing the risks and benefits,                            the patient was deemed in satisfactory condition to  undergo the procedure.                           After obtaining informed consent, the colonoscope                            was passed under direct vision. Throughout the                            procedure, the patient's blood pressure, pulse, and                            oxygen saturations were monitored continuously. The                             Colonoscope was introduced through the anus and                            advanced to the the cecum, identified by                            appendiceal orifice and ileocecal valve. The                            ileocecal valve, appendiceal orifice, and rectum                            were photographed. The quality of the bowel                            preparation was excellent. The colonoscopy was                            performed without difficulty. The patient tolerated                            the procedure well. The bowel preparation used was                            SUPREP via split dose instruction. Scope In: 8:56:33 AM Scope Out: 9:07:44 AM Scope Withdrawal Time: 0 hours 9 minutes 31 seconds  Total Procedure Duration: 0 hours 11 minutes 11 seconds  Findings:                 A 1 mm polyp was found in the ascending colon. The                            polyp was removed with a cold snare. Resection and                            retrieval were complete.                           A few small-mouthed diverticula were found in the  sigmoid colon.                           The exam was otherwise without abnormality on                            direct and retroflexion views. Complications:            No immediate complications. Estimated blood loss:                            None. Estimated Blood Loss:     Estimated blood loss: none. Impression:               - One 1 mm polyp in the ascending colon, removed                            with a cold snare. Resected and retrieved.                           - Diverticulosis in the sigmoid colon.                           - The examination was otherwise normal on direct                            and retroflexion views. Recommendation:           - Repeat colonoscopy in 5 years for surveillance                            (personal history).                           - Patient has a contact  number available for                            emergencies. The signs and symptoms of potential                            delayed complications were discussed with the                            patient. Return to normal activities tomorrow.                            Written discharge instructions were provided to the                            patient.                           - Resume previous diet.                           - Continue present medications.                           -  Await pathology results. Norleen SAILOR. Abran, MD 07/02/2024 9:12:09 AM This report has been signed electronically.

## 2024-07-02 NOTE — Patient Instructions (Signed)
Thank you for letting us take care of your healthcare needs today. Please see handouts given to you on Polyps and Diverticulosis.    YOU HAD AN ENDOSCOPIC PROCEDURE TODAY AT [facility]:   Refer to the procedure report that was given to you for any specific questions about what was found during the examination.  If the procedure report does not answer your questions, please call your gastroenterologist to clarify.  If you requested that your care partner not be given the details of your procedure findings, then the procedure report has been included in a sealed envelope for you to review at your convenience later.  YOU SHOULD EXPECT: Some feelings of bloating in the abdomen. Passage of more gas than usual.  Walking can help get rid of the air that was put into your GI tract during the procedure and reduce the bloating. If you had a lower endoscopy (such as a colonoscopy or flexible sigmoidoscopy) you may notice spotting of blood in your stool or on the toilet paper. If you underwent a bowel prep for your procedure, you may not have a normal bowel movement for a few days.  Please Note:  You might notice some irritation and congestion in your nose or some drainage.  This is from the oxygen used during your procedure.  There is no need for concern and it should clear up in a day or so.  SYMPTOMS TO REPORT IMMEDIATELY:  Following lower endoscopy (colonoscopy or flexible sigmoidoscopy):  Excessive amounts of blood in the stool  Significant tenderness or worsening of abdominal pains  Swelling of the abdomen that is new, acute  Fever of 100F or higher   For urgent or emergent issues, a gastroenterologist can be reached at any hour by calling [phone]. Do not use MyChart messaging for urgent concerns.    DIET:  We do recommend a small meal at first, but then you may proceed to your regular diet.  Drink plenty of fluids but you should avoid alcoholic beverages for 24  hours.  ACTIVITY:  You should plan to take it easy for the rest of today and you should NOT DRIVE or use heavy machinery until tomorrow (because of the sedation medicines used during the test).    FOLLOW UP: Our staff will call the number listed on your records the next business day following your procedure.  We will call around 7:15- 8:00 am to check on you and address any questions or concerns that you may have regarding the information given to you following your procedure. If we do not reach you, we will leave a message.     If any biopsies were taken you will be contacted by phone or by letter within the next 1-3 weeks.  Please call us at [phone] if you have not heard about the biopsies in 3 weeks.    SIGNATURES/CONFIDENTIALITY: You and/or your care partner have signed paperwork which will be entered into your electronic medical record.  These signatures attest to the fact that that the information above on your After Visit Summary has been reviewed and is understood.  Full responsibility of the confidentiality of this discharge information lies with you and/or your care-partner.

## 2024-07-03 ENCOUNTER — Telehealth: Payer: Self-pay | Admitting: Lactation Services

## 2024-07-03 NOTE — Telephone Encounter (Signed)
  Follow up Call-     07/02/2024    7:41 AM  Call back number  Post procedure Call Back phone  # [phone]  Permission to leave phone message Yes     Patient questions:  Do you have a fever, pain , or abdominal swelling? No. Pain Score  0 *  Have you tolerated food without any problems? Yes.    Have you been able to return to your normal activities? Yes.    Do you have any questions about your discharge instructions: Diet   No. Medications  No. Follow up visit  No.  Do you have questions or concerns about your Care? No.  Actions: * If pain score is 4 or above: No action needed, pain <4.

## 2024-07-04 ENCOUNTER — Ambulatory Visit: Payer: Self-pay | Admitting: Internal Medicine

## 2024-07-04 LAB — SURGICAL PATHOLOGY

## 2024-07-25 NOTE — Progress Notes (Unsigned)
°  Cardiology Office Note:  .   Date:  07/31/2024 ID:  EVIANNA CHANDRAN, DOB 1956-01-18, MRN [id number] PCP: Norleen Lynwood ORN, MD [facility] Providers Cardiologist:  None { Click to update primary MD,subspecialty MD or APP then REFRESH:1}   Patient Profile: .      PMH Coronary artery diseae CT Calcium  score 05/29/24 CAC Score 217 (87th percentile) LM 0, LAD 0, LCx 217, RCA 0 CT Calcium  score 02/15/21 CAC Score 0.5 (54th percentile) LCx 0.5 Hyperlipidemia Obesity    History of Present Illness: SABRA   Jessica Macdonald is a *** 68 y.o. female  who is here today for new patient consult for ***  Family history: Her family history includes BRCA 1/2 in her brother; Colon cancer in her mother and paternal grandmother; Head & neck cancer (age of onset: 41) in her brother; Heart disease in her father; Melanoma in her sister; Prostate cancer in her father; Prostate cancer (age of onset: 44) in her brother; Throat cancer in her paternal grandfather.   Discussed the use of AI scribe software for clinical note transcription with the patient, who gave verbal consent to proceed.  ASCVD Risk Score: The 10-year ASCVD risk score (Arnett DK, et al., 2019) is: 4.5%   Values used to calculate the score:     Age: 47 years     Clincally relevant sex: Female     Is Non-Hispanic African American: No     Diabetic: No     Tobacco smoker: No     Systolic Blood Pressure: 98 mmHg     Is BP treated: No     HDL Cholesterol: 57.1 mg/dL     Total Cholesterol: 243 mg/dL  {MD Calc ASCVD Calculator :1}  Diet:  Activity:   No results found for: LIPOA    ROS: ***       Studies Reviewed: SABRA   EKG Interpretation Date/Time:  Wednesday July 31 2024 13:50:30 EST Ventricular Rate:  81 PR Interval:  136 QRS Duration:  76 QT Interval:  366 QTC Calculation: 425 R Axis:   7  Text Interpretation: Normal sinus rhythm Normal ECG When compared with ECG of 15-Jan-2009 13:42, No significant change was found  Confirmed by Percy Browning [phone]) on 07/31/2024 1:59:41 PM      *** Risk Assessment/Calculations:             Physical Exam:   VS: BP 98/60 (BP Location: Left Arm, Patient Position: Sitting, Cuff Size: Large)   Pulse 81   Ht 5' 3 (1.6 m)   Wt 187 lb (84.8 kg)   SpO2 94%   BMI 33.13 kg/m   Wt Readings from Last 3 Encounters:  07/31/24 187 lb (84.8 kg)  07/02/24 180 lb (81.6 kg)  06/18/24 180 lb (81.6 kg)     GEN: Well nourished, well developed in no acute distress NECK: No JVD; No carotid bruits CARDIAC: ***RRR, no murmurs, rubs, gallops RESPIRATORY:  Clear to auscultation without rales, wheezing or rhonchi  ABDOMEN: Soft, non-tender, non-distended EXTREMITIES:  No edema; No deformity     ASSESSMENT AND PLAN: .     Plan/Goals:{ Click here to update goals :1}        {Are you ordering a CV Procedure (e.g. stress test, cath, DCCV, TEE, etc)?   Press F2        :[id number]}  Dispo: ***  Signed, Browning Percy, NP-C

## 2024-07-31 ENCOUNTER — Encounter (HOSPITAL_BASED_OUTPATIENT_CLINIC_OR_DEPARTMENT_OTHER): Payer: Self-pay | Admitting: Nurse Practitioner

## 2024-07-31 ENCOUNTER — Ambulatory Visit (HOSPITAL_BASED_OUTPATIENT_CLINIC_OR_DEPARTMENT_OTHER): Admitting: Nurse Practitioner

## 2024-07-31 VITALS — BP 98/60 | HR 81 | Ht 63.0 in | Wt 187.0 lb

## 2024-07-31 DIAGNOSIS — Z7189 Other specified counseling: Secondary | ICD-10-CM | POA: Diagnosis not present

## 2024-07-31 DIAGNOSIS — I2510 Atherosclerotic heart disease of native coronary artery without angina pectoris: Secondary | ICD-10-CM | POA: Diagnosis not present

## 2024-07-31 DIAGNOSIS — Z79899 Other long term (current) drug therapy: Secondary | ICD-10-CM

## 2024-07-31 DIAGNOSIS — E785 Hyperlipidemia, unspecified: Secondary | ICD-10-CM | POA: Diagnosis not present

## 2024-07-31 DIAGNOSIS — R931 Abnormal findings on diagnostic imaging of heart and coronary circulation: Secondary | ICD-10-CM

## 2024-07-31 NOTE — Patient Instructions (Signed)
 Medication Instructions:   Your physician recommends that you continue on your current medications as directed. Please refer to the Current Medication list given to you today.   *If you need a refill on your cardiac medications before your next appointment, please call your pharmacy*  Lab Work:  Your physician recommends that you return for a FASTING LPA/NMR, fasting after midnight and 2-3 months after starting the Rosuvastatin .    If you have labs (blood work) drawn today and your tests are completely normal, you will receive your results only by: MyChart Message (if you have MyChart) OR A paper copy in the mail If you have any lab test that is abnormal or we need to change your treatment, we will call you to review the results.  Testing/Procedures:  Your physician has requested that you have an echocardiogram. Echocardiography is a painless test that uses sound waves to create images of your heart. It provides your doctor with information about the size and shape of your heart and how well your hearts chambers and valves are working. This procedure takes approximately one hour. There are no restrictions for this procedure. Please do NOT wear cologne, perfume or lotions (deodorant is allowed). Please arrive 15 minutes prior to your appointment time.  Follow-Up: At [facility], you and your health needs are our priority.  As part of our continuing mission to provide you with exceptional heart care, our providers are all part of one team.  This team includes your primary Cardiologist (physician) and Advanced Practice Providers or APPs (Physician Assistants and Nurse Practitioners) who all work together to provide you with the care you need, when you need it.  Your next appointment:   3 month(s)  Provider:   Rosaline Bane, NP    We recommend signing up for the patient portal called MyChart.  Sign up information is provided on this After Visit Summary.  MyChart is used to  connect with patients for Virtual Visits (Telemedicine).  Patients are able to view lab/test results, encounter notes, upcoming appointments, etc.  Non-urgent messages can be sent to your provider as well.   To learn more about what you can do with MyChart, go to [email or web address].   Other Instructions  Adopting a Healthy Lifestyle.   Weight: Know what a healthy weight is for you (roughly BMI <25) and aim to maintain this. You can calculate your body mass index on your smart phone. Unfortunately, this is not the most accurate measure of healthy weight, but it is the simplest measurement to use. A more accurate measurement involves body scanning which measures lean muscle, fat tissue and bony density. We do not have this equipment at [facility].    Diet: Aim for 7+ servings of fruits and vegetables daily Limit animal fats in diet for cholesterol and heart health - choose grass fed whenever available Avoid highly processed foods (fast food burgers, tacos, fried chicken, pizza, hot dogs, french fries)  Saturated fat comes in the form of butter, lard, coconut oil, margarine, partially hydrogenated oils, dairy products, and fat in meat. These increase your risk of cardiovascular disease.  Use healthy plant oils, such as olive, canola, soy, corn, sunflower and peanut.  Whole foods such as fruits, vegetables and whole grains have fiber  Men need > 38 grams of fiber per day Women need > 25 grams of fiber per day  Load up on vegetables and fruits - one-half of your plate: Aim for color and variety, and remember that potatoes dont count.  Go for whole grains - one-quarter of your plate: Whole wheat, barley, wheat berries, quinoa, oats, brown rice, and foods made with them. If you want pasta, go with whole wheat pasta. Protein power - one-quarter of your plate: Fish, chicken, beans, and nuts are all healthy, versatile protein sources. Limit red meat. You need carbohydrates for energy! The type of  carbohydrate is more important than the amount. Choose carbohydrates such as vegetables, fruits, whole grains, beans, and nuts in the place of white rice, white pasta, potatoes (baked or fried), macaroni and cheese, cakes, cookies, and donuts.  If youre thirsty, drink water. Coffee and tea are good in moderation, but skip sugary drinks and limit milk and dairy products to one or two daily servings. Keep sugar intake at 6 teaspoons or 24 grams or LESS       Exercise: Aim for 150 min of moderate intensity exercise weekly for heart health, and weights twice weekly for bone health Stay active - any steps are better than no steps! Aim for 7-9 hours of sleep daily   Sleep: This provides your body with the reset and relaxation that it needs!  Aim to get 7-8 hours of sleep each night. Limit caffeine, screen time, and other distractions prior to bedtime.  Keep your bedroom cool and dark and do not wear heavy clothing to bed or use heavy bed covers - layer if needed.

## 2024-08-02 ENCOUNTER — Encounter (HOSPITAL_BASED_OUTPATIENT_CLINIC_OR_DEPARTMENT_OTHER): Payer: Self-pay | Admitting: Nurse Practitioner

## 2024-09-04 ENCOUNTER — Ambulatory Visit (INDEPENDENT_AMBULATORY_CARE_PROVIDER_SITE_OTHER): Admitting: Nurse Practitioner

## 2024-09-04 ENCOUNTER — Encounter: Payer: Self-pay | Admitting: Nurse Practitioner

## 2024-09-04 VITALS — BP 126/84 | HR 77 | Temp 98.3°F | Ht 62.0 in | Wt 190.6 lb

## 2024-09-04 DIAGNOSIS — Z7689 Persons encountering health services in other specified circumstances: Secondary | ICD-10-CM

## 2024-09-04 DIAGNOSIS — C439 Malignant melanoma of skin, unspecified: Secondary | ICD-10-CM

## 2024-09-04 DIAGNOSIS — E7800 Pure hypercholesterolemia, unspecified: Secondary | ICD-10-CM | POA: Diagnosis not present

## 2024-09-04 DIAGNOSIS — E66811 Obesity, class 1: Secondary | ICD-10-CM

## 2024-09-04 DIAGNOSIS — R931 Abnormal findings on diagnostic imaging of heart and coronary circulation: Secondary | ICD-10-CM

## 2024-09-04 NOTE — Assessment & Plan Note (Signed)
 History of the same followed by Jessica Macdonald dermatology.  Continue to follow with dermatologist as recommended

## 2024-09-04 NOTE — Assessment & Plan Note (Signed)
 History of same.  Patient is prediabetic and elevated cholesterol.  Continue working on lifestyle modifications inclusive of exercise

## 2024-09-04 NOTE — Patient Instructions (Signed)
 Nice to see you today I want to see you in around 8-9 months for your physical, sooner if you need me

## 2024-09-04 NOTE — Progress Notes (Signed)
 "  Established Patient Office Visit  Subjective   Patient ID: Jessica Macdonald, female    DOB: [birth year]  Age: 69 y.o. MRN: [id number]  Chief Complaint  Patient presents with   Transitions Of Care    Getting bone density scan with gyno this year.     HPI  Coronary artery calcium  score/coronary artery disease: Patient currently maintained on rosuvastatin  10 mg daily and followed by cardiology.  Melanoma: Jessica Macdonald every 6 months .  Tdap: within 10 years Flu: 2025 PNA: 2023 Shingles: Discussed in office.  Colonoscopy:07/02/2024, repeat 5 years.  Patient due 2030 Pap smear: Aged out? 09/23/2014. Last one was 03/2024 physcian for womens DEXA scan: with GYN Mammogram:03/2024    Review of Systems  Constitutional:  Negative for chills and fever.  Respiratory:  Negative for shortness of breath.   Cardiovascular:  Negative for chest pain.  Neurological:  Negative for headaches.      Objective:     BP 126/84   Pulse 77   Temp 98.3 F (36.8 C) (Oral)   Ht 5' 2 (1.575 m)   Wt 190 lb 9.6 oz (86.5 kg)   SpO2 98%   BMI 34.86 kg/m    Physical Exam Vitals and nursing note reviewed.  Constitutional:      Appearance: Normal appearance.  HENT:     Right Ear: Tympanic membrane, ear canal and external ear normal.     Left Ear: Tympanic membrane, ear canal and external ear normal.     Mouth/Throat:     Mouth: Mucous membranes are moist.     Pharynx: Oropharynx is clear.  Eyes:     Extraocular Movements: Extraocular movements intact.     Pupils: Pupils are equal, round, and reactive to light.  Cardiovascular:     Rate and Rhythm: Normal rate and regular rhythm.     Pulses: Normal pulses.     Heart sounds: Normal heart sounds.  Pulmonary:     Effort: Pulmonary effort is normal.     Breath sounds: Normal breath sounds.  Abdominal:     General: Bowel sounds are normal. There is no distension.     Palpations: There is no mass.     Tenderness: There is no abdominal  tenderness.     Hernia: No hernia is present.  Musculoskeletal:     Right lower leg: No edema.     Left lower leg: No edema.  Lymphadenopathy:     Cervical: No cervical adenopathy.  Skin:    General: Skin is warm.  Neurological:     General: No focal deficit present.     Mental Status: She is alert.     Deep Tendon Reflexes:     Reflex Scores:      Bicep reflexes are 2+ on the right side and 2+ on the left side.      Patellar reflexes are 2+ on the right side and 2+ on the left side.    Comments: Bilateral upper and lower extremity strength 5/5  Psychiatric:        Mood and Affect: Mood normal.        Behavior: Behavior normal.        Thought Content: Thought content normal.        Judgment: Judgment normal.      No results found for any visits on 09/04/24.    The 10-year ASCVD risk score (Arnett DK, et al., 2019) is: 7.2%    Assessment & Plan:   Problem  List Items Addressed This Visit       Cardiovascular and Mediastinum   Elevated coronary artery calcium  score - Primary   History of same.  Patient on baby aspirin , rosuvastatin  10 mg daily.  Followed by cardiology.  Continue taking medication as prescribed continue following specialist as recommended        Other   Melanoma Jessica Macdonald)   History of the same followed by Jessica Macdonald dermatology.  Continue to follow with dermatologist as recommended      HLD (hyperlipidemia)   Reviewed most recent lipid panel.  Continue rosuvastatin  10 mg daily.  Continue follow with cardiology      Obesity (BMI 30.0-34.9)   History of same.  Patient is prediabetic and elevated cholesterol.  Continue working on lifestyle modifications inclusive of exercise      Encounter to establish care    Return in about 9 months (around 06/04/2025) for CPE and Labs.    Jessica Crandall, NP  "

## 2024-09-04 NOTE — Assessment & Plan Note (Signed)
 History of same.  Patient on baby aspirin , rosuvastatin  10 mg daily.  Followed by cardiology.  Continue taking medication as prescribed continue following specialist as recommended

## 2024-09-04 NOTE — Assessment & Plan Note (Signed)
 Reviewed most recent lipid panel.  Continue rosuvastatin  10 mg daily.  Continue follow with cardiology

## 2024-09-06 ENCOUNTER — Other Ambulatory Visit (HOSPITAL_BASED_OUTPATIENT_CLINIC_OR_DEPARTMENT_OTHER)

## 2024-09-06 DIAGNOSIS — R931 Abnormal findings on diagnostic imaging of heart and coronary circulation: Secondary | ICD-10-CM | POA: Diagnosis not present

## 2024-09-06 DIAGNOSIS — I082 Rheumatic disorders of both aortic and tricuspid valves: Secondary | ICD-10-CM

## 2024-09-07 LAB — ECHOCARDIOGRAM COMPLETE
AR max vel: 1.83 cm2
AV Area VTI: 1.83 cm2
AV Area mean vel: 1.69 cm2
AV Mean grad: 3 mmHg
AV Peak grad: 5.1 mmHg
AV Vena cont: 0.38 cm
Ao pk vel: 1.13 m/s
Area-P 1/2: 3.4 cm2
S' Lateral: 2.85 cm

## 2024-09-08 ENCOUNTER — Ambulatory Visit (HOSPITAL_BASED_OUTPATIENT_CLINIC_OR_DEPARTMENT_OTHER): Payer: Self-pay | Admitting: Nurse Practitioner

## 2024-09-10 ENCOUNTER — Encounter: Payer: Self-pay | Admitting: Nurse Practitioner

## 2024-10-25 ENCOUNTER — Ambulatory Visit

## 2024-11-07 ENCOUNTER — Ambulatory Visit (HOSPITAL_BASED_OUTPATIENT_CLINIC_OR_DEPARTMENT_OTHER): Admitting: Nurse Practitioner

## 2025-05-13 ENCOUNTER — Other Ambulatory Visit

## 2025-05-19 ENCOUNTER — Encounter: Admitting: Internal Medicine

## 2025-05-20 ENCOUNTER — Encounter: Admitting: Nurse Practitioner
